# Patient Record
Sex: Male | Born: 1935 | Race: White | Hispanic: No | Marital: Married | Smoking: Never smoker
Health system: Southern US, Community
[De-identification: ages and names within clinical notes are randomized; demographics above are authoritative.]

## PROBLEM LIST (undated history)

## (undated) DIAGNOSIS — E538 Deficiency of other specified B group vitamins: Secondary | ICD-10-CM

## (undated) DIAGNOSIS — K219 Gastro-esophageal reflux disease without esophagitis: Secondary | ICD-10-CM

## (undated) DIAGNOSIS — D500 Iron deficiency anemia secondary to blood loss (chronic): Secondary | ICD-10-CM

## (undated) DIAGNOSIS — I4891 Unspecified atrial fibrillation: Secondary | ICD-10-CM

## (undated) DIAGNOSIS — N189 Chronic kidney disease, unspecified: Secondary | ICD-10-CM

## (undated) DIAGNOSIS — E781 Pure hyperglyceridemia: Secondary | ICD-10-CM

## (undated) DIAGNOSIS — K264 Chronic or unspecified duodenal ulcer with hemorrhage: Secondary | ICD-10-CM

## (undated) DIAGNOSIS — Z86010 Personal history of colonic polyps: Secondary | ICD-10-CM

## (undated) DIAGNOSIS — K589 Irritable bowel syndrome without diarrhea: Secondary | ICD-10-CM

## (undated) DIAGNOSIS — T8859XA Other complications of anesthesia, initial encounter: Secondary | ICD-10-CM

## (undated) DIAGNOSIS — K648 Other hemorrhoids: Secondary | ICD-10-CM

## (undated) DIAGNOSIS — E669 Obesity, unspecified: Secondary | ICD-10-CM

## (undated) DIAGNOSIS — J189 Pneumonia, unspecified organism: Secondary | ICD-10-CM

## (undated) DIAGNOSIS — K5790 Diverticulosis of intestine, part unspecified, without perforation or abscess without bleeding: Secondary | ICD-10-CM

## (undated) DIAGNOSIS — I509 Heart failure, unspecified: Secondary | ICD-10-CM

## (undated) DIAGNOSIS — T4145XA Adverse effect of unspecified anesthetic, initial encounter: Secondary | ICD-10-CM

## (undated) DIAGNOSIS — E119 Type 2 diabetes mellitus without complications: Secondary | ICD-10-CM

## (undated) DIAGNOSIS — G4733 Obstructive sleep apnea (adult) (pediatric): Secondary | ICD-10-CM

## (undated) DIAGNOSIS — M1990 Unspecified osteoarthritis, unspecified site: Secondary | ICD-10-CM

## (undated) DIAGNOSIS — R0602 Shortness of breath: Secondary | ICD-10-CM

## (undated) DIAGNOSIS — E7800 Pure hypercholesterolemia, unspecified: Secondary | ICD-10-CM

## (undated) DIAGNOSIS — I10 Essential (primary) hypertension: Secondary | ICD-10-CM

## (undated) DIAGNOSIS — E039 Hypothyroidism, unspecified: Secondary | ICD-10-CM

## (undated) DIAGNOSIS — Z794 Long term (current) use of insulin: Secondary | ICD-10-CM

## (undated) HISTORY — DX: Diverticulosis of intestine, part unspecified, without perforation or abscess without bleeding: K57.90

## (undated) HISTORY — PX: PORT A CATH REVISION: SHX6033

## (undated) HISTORY — DX: Unspecified atrial fibrillation: I48.91

## (undated) HISTORY — PX: COLONOSCOPY: SHX174

## (undated) HISTORY — PX: EYE SURGERY: SHX253

## (undated) HISTORY — PX: BACK SURGERY: SHX140

## (undated) HISTORY — PX: CHOLECYSTECTOMY: SHX55

## (undated) HISTORY — PX: OTHER SURGICAL HISTORY: SHX169

## (undated) HISTORY — DX: Other hemorrhoids: K64.8

## (undated) HISTORY — PX: TOTAL KNEE ARTHROPLASTY: SHX125

---

## 2003-07-21 ENCOUNTER — Ambulatory Visit (HOSPITAL_COMMUNITY): Admission: RE | Admit: 2003-07-21 | Discharge: 2003-07-22 | Payer: Self-pay | Admitting: Orthopedic Surgery

## 2007-10-08 ENCOUNTER — Inpatient Hospital Stay (HOSPITAL_COMMUNITY): Admission: RE | Admit: 2007-10-08 | Discharge: 2007-10-12 | Payer: Self-pay | Admitting: Orthopedic Surgery

## 2008-12-17 ENCOUNTER — Ambulatory Visit (HOSPITAL_COMMUNITY): Admission: RE | Admit: 2008-12-17 | Discharge: 2008-12-17 | Payer: Self-pay | Admitting: Orthopedic Surgery

## 2009-10-13 DIAGNOSIS — Z86010 Personal history of colonic polyps: Secondary | ICD-10-CM

## 2009-10-13 DIAGNOSIS — K648 Other hemorrhoids: Secondary | ICD-10-CM

## 2009-10-13 DIAGNOSIS — K5790 Diverticulosis of intestine, part unspecified, without perforation or abscess without bleeding: Secondary | ICD-10-CM

## 2009-10-13 DIAGNOSIS — Z860101 Personal history of adenomatous and serrated colon polyps: Secondary | ICD-10-CM

## 2009-10-13 HISTORY — DX: Personal history of colonic polyps: Z86.010

## 2009-10-13 HISTORY — DX: Diverticulosis of intestine, part unspecified, without perforation or abscess without bleeding: K57.90

## 2009-10-13 HISTORY — DX: Other hemorrhoids: K64.8

## 2009-10-13 HISTORY — DX: Personal history of adenomatous and serrated colon polyps: Z86.0101

## 2009-10-21 ENCOUNTER — Encounter: Payer: Self-pay | Admitting: Physician Assistant

## 2009-11-04 ENCOUNTER — Inpatient Hospital Stay (HOSPITAL_COMMUNITY): Admission: EM | Admit: 2009-11-04 | Discharge: 2009-11-07 | Payer: Self-pay | Admitting: Emergency Medicine

## 2009-11-05 ENCOUNTER — Ambulatory Visit: Payer: Self-pay | Admitting: Internal Medicine

## 2009-11-09 ENCOUNTER — Encounter (INDEPENDENT_AMBULATORY_CARE_PROVIDER_SITE_OTHER): Payer: Self-pay | Admitting: *Deleted

## 2009-11-23 ENCOUNTER — Telehealth: Payer: Self-pay | Admitting: Internal Medicine

## 2009-12-10 ENCOUNTER — Ambulatory Visit: Payer: Self-pay | Admitting: Internal Medicine

## 2009-12-10 DIAGNOSIS — K219 Gastro-esophageal reflux disease without esophagitis: Secondary | ICD-10-CM | POA: Insufficient documentation

## 2009-12-10 DIAGNOSIS — R1310 Dysphagia, unspecified: Secondary | ICD-10-CM | POA: Insufficient documentation

## 2009-12-10 DIAGNOSIS — D62 Acute posthemorrhagic anemia: Secondary | ICD-10-CM | POA: Insufficient documentation

## 2009-12-11 LAB — CONVERTED CEMR LAB
Basophils Absolute: 0 10*3/uL (ref 0.0–0.1)
Basophils Relative: 0.8 % (ref 0.0–3.0)
Eosinophils Absolute: 0.1 10*3/uL (ref 0.0–0.7)
Eosinophils Relative: 2.6 % (ref 0.0–5.0)
HCT: 29.5 % — ABNORMAL LOW (ref 39.0–52.0)
Hemoglobin: 10.4 g/dL — ABNORMAL LOW (ref 13.0–17.0)
Lymphocytes Relative: 33.5 % (ref 12.0–46.0)
Lymphs Abs: 1.5 10*3/uL (ref 0.7–4.0)
MCHC: 35.2 g/dL (ref 30.0–36.0)
MCV: 95.8 fL (ref 78.0–100.0)
Monocytes Absolute: 0.4 10*3/uL (ref 0.1–1.0)
Monocytes Relative: 8 % (ref 3.0–12.0)
Neutro Abs: 2.5 10*3/uL (ref 1.4–7.7)
Neutrophils Relative %: 55.1 % (ref 43.0–77.0)
Platelets: 257 10*3/uL (ref 150.0–400.0)
RBC: 3.08 M/uL — ABNORMAL LOW (ref 4.22–5.81)
RDW: 15.3 % — ABNORMAL HIGH (ref 11.5–14.6)
WBC: 4.5 10*3/uL (ref 4.5–10.5)

## 2009-12-22 ENCOUNTER — Telehealth (INDEPENDENT_AMBULATORY_CARE_PROVIDER_SITE_OTHER): Payer: Self-pay | Admitting: *Deleted

## 2010-01-13 ENCOUNTER — Telehealth: Payer: Self-pay | Admitting: Internal Medicine

## 2010-01-13 ENCOUNTER — Ambulatory Visit: Payer: Self-pay | Admitting: Gastroenterology

## 2010-01-13 DIAGNOSIS — Z860100 Personal history of colon polyps, unspecified: Secondary | ICD-10-CM | POA: Insufficient documentation

## 2010-01-13 DIAGNOSIS — M1990 Unspecified osteoarthritis, unspecified site: Secondary | ICD-10-CM | POA: Insufficient documentation

## 2010-01-13 DIAGNOSIS — A09 Infectious gastroenteritis and colitis, unspecified: Secondary | ICD-10-CM | POA: Insufficient documentation

## 2010-01-13 DIAGNOSIS — E039 Hypothyroidism, unspecified: Secondary | ICD-10-CM | POA: Insufficient documentation

## 2010-01-13 DIAGNOSIS — R634 Abnormal weight loss: Secondary | ICD-10-CM

## 2010-01-15 ENCOUNTER — Encounter: Payer: Self-pay | Admitting: Physician Assistant

## 2010-01-15 LAB — CONVERTED CEMR LAB
BUN: 43 mg/dL — ABNORMAL HIGH (ref 6–23)
Basophils Absolute: 0 10*3/uL (ref 0.0–0.1)
Basophils Relative: 0.6 % (ref 0.0–3.0)
CO2: 25 meq/L (ref 19–32)
Calcium: 8.9 mg/dL (ref 8.4–10.5)
Chloride: 110 meq/L (ref 96–112)
Creatinine, Ser: 2 mg/dL — ABNORMAL HIGH (ref 0.4–1.5)
Eosinophils Absolute: 0.1 10*3/uL (ref 0.0–0.7)
Eosinophils Relative: 2.1 % (ref 0.0–5.0)
GFR calc non Af Amer: 34.86 mL/min (ref >60)
Glucose, Bld: 166 mg/dL — ABNORMAL HIGH (ref 70–99)
HCT: 30.9 % — ABNORMAL LOW (ref 39.0–52.0)
Hemoglobin: 11.1 g/dL — ABNORMAL LOW (ref 13.0–17.0)
Lymphocytes Relative: 31.7 % (ref 12.0–46.0)
Lymphs Abs: 1.5 10*3/uL (ref 0.7–4.0)
MCHC: 36 g/dL (ref 30.0–36.0)
MCV: 96.2 fL (ref 78.0–100.0)
Monocytes Absolute: 0.4 10*3/uL (ref 0.1–1.0)
Monocytes Relative: 9.1 % (ref 3.0–12.0)
Neutro Abs: 2.7 10*3/uL (ref 1.4–7.7)
Neutrophils Relative %: 56.5 % (ref 43.0–77.0)
Platelets: 230 10*3/uL (ref 150.0–400.0)
Potassium: 5.2 meq/L — ABNORMAL HIGH (ref 3.5–5.1)
RBC: 3.21 M/uL — ABNORMAL LOW (ref 4.22–5.81)
RDW: 13.9 % (ref 11.5–14.6)
Sodium: 144 meq/L (ref 135–145)
WBC: 4.8 10*3/uL (ref 4.5–10.5)

## 2010-01-18 ENCOUNTER — Ambulatory Visit: Payer: Self-pay | Admitting: Physician Assistant

## 2010-01-20 LAB — CONVERTED CEMR LAB
BUN: 39 mg/dL — ABNORMAL HIGH (ref 6–23)
CO2: 26 meq/L (ref 19–32)
Calcium: 9.1 mg/dL (ref 8.4–10.5)
Chloride: 110 meq/L (ref 96–112)
Creatinine, Ser: 1.7 mg/dL — ABNORMAL HIGH (ref 0.4–1.5)
GFR calc non Af Amer: 40.94 mL/min (ref >60)
Glucose, Bld: 175 mg/dL — ABNORMAL HIGH (ref 70–99)
Potassium: 5 meq/L (ref 3.5–5.1)
Sodium: 144 meq/L (ref 135–145)

## 2010-01-28 ENCOUNTER — Ambulatory Visit: Payer: Self-pay | Admitting: Gastroenterology

## 2010-01-28 DIAGNOSIS — E785 Hyperlipidemia, unspecified: Secondary | ICD-10-CM | POA: Insufficient documentation

## 2010-01-28 DIAGNOSIS — K56609 Unspecified intestinal obstruction, unspecified as to partial versus complete obstruction: Secondary | ICD-10-CM | POA: Insufficient documentation

## 2010-01-28 DIAGNOSIS — I10 Essential (primary) hypertension: Secondary | ICD-10-CM | POA: Insufficient documentation

## 2010-01-28 DIAGNOSIS — G4730 Sleep apnea, unspecified: Secondary | ICD-10-CM | POA: Insufficient documentation

## 2010-03-04 ENCOUNTER — Ambulatory Visit: Payer: Self-pay | Admitting: Internal Medicine

## 2010-03-05 ENCOUNTER — Telehealth: Payer: Self-pay | Admitting: Internal Medicine

## 2010-03-05 ENCOUNTER — Ambulatory Visit: Payer: Self-pay | Admitting: Internal Medicine

## 2010-03-05 LAB — CONVERTED CEMR LAB
Basophils Absolute: 0 10*3/uL (ref 0.0–0.1)
Basophils Relative: 0.8 % (ref 0.0–3.0)
Eosinophils Absolute: 0.1 10*3/uL (ref 0.0–0.7)
Eosinophils Relative: 2.4 % (ref 0.0–5.0)
HCT: 30 % — ABNORMAL LOW (ref 39.0–52.0)
Hemoglobin: 10.6 g/dL — ABNORMAL LOW (ref 13.0–17.0)
Lymphocytes Relative: 30.8 % (ref 12.0–46.0)
Lymphs Abs: 1.5 10*3/uL (ref 0.7–4.0)
MCHC: 35.3 g/dL (ref 30.0–36.0)
MCV: 96.6 fL (ref 78.0–100.0)
Monocytes Absolute: 0.4 10*3/uL (ref 0.1–1.0)
Monocytes Relative: 8.9 % (ref 3.0–12.0)
Neutro Abs: 2.7 10*3/uL (ref 1.4–7.7)
Neutrophils Relative %: 57.1 % (ref 43.0–77.0)
Platelets: 212 10*3/uL (ref 150.0–400.0)
RBC: 3.1 M/uL — ABNORMAL LOW (ref 4.22–5.81)
RDW: 13.6 % (ref 11.5–14.6)
WBC: 4.8 10*3/uL (ref 4.5–10.5)

## 2010-03-09 LAB — CONVERTED CEMR LAB
Ferritin: 81.7 ng/mL (ref 22.0–322.0)
Folate: 19.2 ng/mL
Iron: 128 ug/dL (ref 42–165)
Saturation Ratios: 32.7 % (ref 20.0–50.0)
Transferrin: 279.8 mg/dL (ref 212.0–360.0)
Vitamin B-12: 166 pg/mL — ABNORMAL LOW (ref 211–911)

## 2010-06-04 ENCOUNTER — Ambulatory Visit: Payer: Self-pay | Admitting: Internal Medicine

## 2010-06-04 DIAGNOSIS — R1319 Other dysphagia: Secondary | ICD-10-CM

## 2010-06-04 DIAGNOSIS — R112 Nausea with vomiting, unspecified: Secondary | ICD-10-CM | POA: Insufficient documentation

## 2010-06-07 ENCOUNTER — Ambulatory Visit (HOSPITAL_COMMUNITY): Admission: RE | Admit: 2010-06-07 | Discharge: 2010-06-07 | Payer: Self-pay | Admitting: Internal Medicine

## 2010-07-07 ENCOUNTER — Telehealth: Payer: Self-pay | Admitting: Internal Medicine

## 2010-07-14 ENCOUNTER — Ambulatory Visit: Payer: Self-pay | Admitting: Internal Medicine

## 2010-09-08 ENCOUNTER — Ambulatory Visit
Admission: RE | Admit: 2010-09-08 | Discharge: 2010-09-08 | Payer: Self-pay | Source: Home / Self Care | Attending: Internal Medicine | Admitting: Internal Medicine

## 2010-09-08 ENCOUNTER — Other Ambulatory Visit: Payer: Self-pay | Admitting: Internal Medicine

## 2010-09-08 DIAGNOSIS — D510 Vitamin B12 deficiency anemia due to intrinsic factor deficiency: Secondary | ICD-10-CM | POA: Insufficient documentation

## 2010-09-08 LAB — CBC WITH DIFFERENTIAL/PLATELET
Basophils Absolute: 0 10*3/uL (ref 0.0–0.1)
Basophils Relative: 0.4 % (ref 0.0–3.0)
Eosinophils Absolute: 0.1 10*3/uL (ref 0.0–0.7)
Eosinophils Relative: 2 % (ref 0.0–5.0)
HCT: 30.9 % — ABNORMAL LOW (ref 39.0–52.0)
Hemoglobin: 10.8 g/dL — ABNORMAL LOW (ref 13.0–17.0)
Lymphocytes Relative: 27.2 % (ref 12.0–46.0)
Lymphs Abs: 1.4 10*3/uL (ref 0.7–4.0)
MCHC: 34.9 g/dL (ref 30.0–36.0)
MCV: 97.7 fl (ref 78.0–100.0)
Monocytes Absolute: 0.5 10*3/uL (ref 0.1–1.0)
Monocytes Relative: 8.9 % (ref 3.0–12.0)
Neutro Abs: 3.2 10*3/uL (ref 1.4–7.7)
Neutrophils Relative %: 61.5 % (ref 43.0–77.0)
Platelets: 200 10*3/uL (ref 150.0–400.0)
RBC: 3.16 Mil/uL — ABNORMAL LOW (ref 4.22–5.81)
RDW: 13.6 % (ref 11.5–14.6)
WBC: 5.2 10*3/uL (ref 4.5–10.5)

## 2010-09-10 ENCOUNTER — Encounter: Payer: Self-pay | Admitting: Internal Medicine

## 2010-09-16 NOTE — Assessment & Plan Note (Signed)
Summary: follow-up diarrhea...   History of Present Illness Visit Type: Follow-up Visit Primary GI MD: Yancey Flemings MD Primary Provider: Tarri Fuller Chief Complaint: still complains of diarrhea, 6-10 loose, watery stools a day History of Present Illness:   77 year old white male with hypertension, hyperlipidemia, long-standing type 2 diabetes mellitus, obesity, sleep apnea, hypothyroidism, osteoarthritis, "colon polyps", and GI bleed secondary to NSAID-induced duodenal ulcer March 2001. The patient was seen by our physician assistant twice in June for diarrhea. Laboratories were unremarkable except for abnormal creatinine, which is chronic. Multiple stool studies, including C. difficile, were negative. He was treated empirically with Flagyl and probiotics. He presents today for routine followup as instructed. Overall patient is doing well. He continues to describe 8-10 bowel movements per day. These are postprandial. Some formed, some watery. No blood. Normal for him as to 3 bowel movements per day. Nonocturnal component. No normal pain. He is had no weight loss. No fevers. Not using antidiarrheals. Colonoscopy in March 2011, elsewhere, was negative. Report reviewed. He is accompanied by his wife and granddaughter. He continues on PPI therapy. Other medications stable.   GI Review of Systems      Denies abdominal pain, acid reflux, belching, bloating, chest pain, dysphagia with liquids, dysphagia with solids, heartburn, loss of appetite, nausea, vomiting, vomiting blood, weight loss, and  weight gain.      Reports diarrhea.     Denies anal fissure, black tarry stools, change in bowel habit, constipation, diverticulosis, fecal incontinence, heme positive stool, hemorrhoids, irritable bowel syndrome, jaundice, light color stool, liver problems, rectal bleeding, and  rectal pain.    Current Medications (verified): 1)  Gemfibrozil 600 Mg Tabs (Gemfibrozil) .Marland Kitchen.. 1 By Mouth Twice Daily 2)   Glyburide-Metformin 2.5-500 Mg Tabs (Glyburide-Metformin) .... 2 By Mouth Two Times A Day 3)  Lantus 100 Unit/ml Soln (Insulin Glargine) .... 40 Units At At Bedtime 4)  Levoxyl 75 Mcg Tabs (Levothyroxine Sodium) .Marland Kitchen.. 1 By Mouth Once Daily 5)  Ferrous Fumarate 325 (106 Fe) Mg Tabs (Ferrous Fumarate) .... Take 1 Tab At Bedtime 6)  Multivitamins  Tabs (Multiple Vitamin) .... Take 1 Tab Daily 7)  Nexium 40 Mg Cpdr (Esomeprazole Magnesium) .... One Tablet By Mouth Once Daily  Allergies (verified): No Known Drug Allergies  Past History:  Past Medical History: Reviewed history from 01/28/2010 and no changes required. Arthritis ACUTE GI BLEED 4/11-SEVERAL DUODENAL ULCERS Adenomatous Colon Polyps-COLON 3/11 [place] FOR C.DIFF 6/11-TOXIN NEGATIVE AODM HYPOTHYROIDISM Hyperlipidemia Hypertension Obesity Sleep Apnea  Past Surgical History: Reviewed history from 01/28/2010 and no changes required. Cholecystectomy Kidney Stone Back Surgery Knee Replacement Neck surgery  Family History: Reviewed history from 12/10/2009 and no changes required. Family History of Diabetes: mother Family History of Heart Disease: father Family History of Uterine Cancer:mother Family History of Colon Cancer:grandmother  Social History: Reviewed history from 12/10/2009 and no changes required. Patient has never smoked.  Alcohol Use - no Daily Caffeine Use 2 per day Illicit Drug Use - no Patient does not get regular exercise.   Review of Systems       The patient complains of fatigue.  The patient denies allergy/sinus, anemia, anxiety-new, arthritis/joint pain, back pain, blood in urine, breast changes/lumps, confusion, cough, coughing up blood, depression-new, fainting, fever, headaches-new, hearing problems, heart murmur, heart rhythm changes, itching, muscle pains/cramps, night sweats, nosebleeds, shortness of breath, skin rash, sleeping problems, sore throat, swelling of feet/legs, swollen  lymph glands, thirst - excessive, urination - excessive, urination changes/pain, urine leakage, vision changes,  and voice change.    Vital Signs:  Patient profile:   77 year old male Height:      72 inches Weight:      264 pounds BMI:     35.93 Pulse rate:   76 / minute Pulse rhythm:   regular BP sitting:   124 / 74  (left arm) Cuff size:   regular  Vitals Entered By: Francee Piccolo CMA Duncan Dull) (March 04, 2010 9:59 AM)  Physical Exam  General:  Well developed, obese, well nourished, no acute distress. Head:  Normocephalic and atraumatic. Eyes:  PERRLA, no icterus.conjunctiva pink Mouth:  No deformity or lesions, . No thrush Neck:  thick Lungs:  Clear throughout to auscultation. Heart:  Regular rate and rhythm; no murmurs, rubs,  or bruits. Abdomen:  Soft, obese,nontender and nondistended. No masses, hepatosplenomegaly or hernias noted. Normal bowel sounds. Msk:  degenerative changes in the joints Pulses:  Normal pulses noted. Extremities:  no edema Neurologic:  alert and oriented Skin:  no jaundice Psych:  Alert and cooperative. Normal mood and affect.   Impression & Recommendations:  Problem # 1:  DIARRHEA (ICD-787.91) postprandial diarrhea without alarm features. No change with empiric therapies. Negative workup as outlined. Sounds like postinfectious IBS picture.  Plan: #1. Use Imodium p.r.n. as directed #2. Followup p.r.n. worsening symptoms or the development of alarm features  Problem # 2:  PERSONAL HX COLONIC POLYPS (ICD-V12.72) prior history of colon polyps. Negative colonoscopy earlier this year. Routine followup due around March 2016. Recall placed in the computer  Problem # 3:  ULCER-DUODENAL (ICD-532.9) duodenal ulcer with bleeding secondary to NSAIDs  Plan:  #1. Avoid NSAIDs #2. Continue PPI  Problem # 4:  ACUTE POSTHEMORRHAGIC ANEMIA (ICD-285.1) posthemorrhagic anemia secondary to ulcer bleed. Recent hemoglobins improving but not normal. Currently  on iron.  Plan: #1. Continue iron #2. Followup CBC today  Other Orders: TLB-CBC Platelet - w/Differential (85025-CBCD)  Patient Instructions: 1)  Labs ordered for patient to have drawn today. 2)  Recall Colonoscopy for 10/2014. 3)  Please schedule a follow-up appointment as needed.  4)  The medication list was reviewed and reconciled.  All changed / newly prescribed medications were explained.  A complete medication list was provided to the patient / caregiver. 5)  Copy Dr. Tarri Fuller

## 2010-09-16 NOTE — Assessment & Plan Note (Signed)
Summary: Diarrhea / N/V   History of Present Illness Visit Type: Follow-up Visit Primary GI MD: Chad Flemings MD Primary Chad Carter: Chad Fuller, MD  Requesting Chad Carter: na Chief Complaint: Nausea and vomiting, and diarrhea History of Present Illness:   77 y.o. w/  HTN, hyperlipidemia, longstanding DM, CRI, obesity, sleep apnea, hypothyroidism, PUD with bleeding DU secondary to NSAIDS, and B12 deficiency. Last seen 03-04-10 for diarrhea. Comes in w/ wife. C/o ongoing postprandial urgency w/ loose stools. No wt loss. Next, intermittent N/V last few weeks. Generally bilious. Finally, vague solid and liquid dysphagia. Normal esophagus on EGD 10-2009.   GI Review of Systems    Reports dysphagia with liquids, dysphagia with solids, nausea, and  vomiting.      Denies abdominal pain, acid reflux, belching, bloating, chest pain, heartburn, loss of appetite, vomiting blood, weight loss, and  weight gain.      Reports diarrhea.     Denies anal fissure, black tarry stools, change in bowel habit, constipation, diverticulosis, fecal incontinence, heme positive stool, hemorrhoids, irritable bowel syndrome, jaundice, light color stool, liver problems, rectal bleeding, and  rectal pain.    Current Medications (verified): 1)  Gemfibrozil 600 Mg Tabs (Gemfibrozil) .Marland Kitchen.. 1 By Mouth Twice Daily 2)  Glyburide-Metformin 2.5-500 Mg Tabs (Glyburide-Metformin) .... 2 By Mouth Two Times A Day 3)  Lantus 100 Unit/ml Soln (Insulin Glargine) .... 40 Units At At Bedtime 4)  Levoxyl 75 Mcg Tabs (Levothyroxine Sodium) .Marland Kitchen.. 1 By Mouth Once Daily 5)  Multivitamins  Tabs (Multiple Vitamin) .... Take 1 Tab Daily 6)  Nexium 40 Mg Cpdr (Esomeprazole Magnesium) .... Take 1 P.o. One Half Hr. Before Breakfast 7)  Enalapril-Hydrochlorothiazide 10-25 Mg Tabs (Enalapril-Hydrochlorothiazide) .... One Tablet By Mouth Once Daily 8)  Cyanocobalamin 1000 Mcg/ml Soln (Cyanocobalamin) .... Once A Month 9)  Cvs Insulin Syringe 30g X 1/2"  0.3 Ml Misc (Insulin Syringe-Needle U-100) .... As Directed 10)  Relion Insulin Syringe 30g X 5/16" 0.5 Ml Misc (Insulin Syringe-Needle U-100) .... As Directed  Allergies (verified): No Known Drug Allergies  Past History:  Past Medical History: Arthritis ACUTE GI BLEED 4/11-SEVERAL DUODENAL ULCERS Adenomatous Colon Polyps-COLON 3/11 [place] FOR C.DIFF 6/11-TOXIN NEGATIVE AODM Obesity DIARRHEA (ICD-787.91) SLEEP APNEA (ICD-780.57) SMALL BOWEL OBSTRUCTION (ICD-560.9) HYPERLIPIDEMIA (ICD-272.4) HYPERTENSION (ICD-401.9) OSTEOARTHRITIS (ICD-715.9) HYPOTHYROIDISM (ICD-244.9) DIABETES MELLITUS-TYPE II (ICD-250.00) FAMILY HX COLON CANCER (ICD-V16.0) PERSONAL HX COLONIC POLYPS (ICD-V12.72) ULCER-DUODENAL (ICD-532.9) ANEMIA-UNSPECIFIED (ICD-285.9) LOSS OF WEIGHT (ICD-783.21) INFECTIOUS DIARRHEA (ICD-009.2) DYSPHAGIA UNSPECIFIED (ICD-787.20) GERD (ICD-530.81) ACUTE POSTHEMORRHAGIC ANEMIA (ICD-285.1) ACUT DUOD ULCER W/HEMORR W/O MENTION OBSTRUCTION (ICD-532.00)  Past Surgical History: Reviewed history from 01/28/2010 and no changes required. Cholecystectomy Kidney Stone Back Surgery Knee Replacement Neck surgery  Family History: Reviewed history from 12/10/2009 and no changes required. Family History of Diabetes: mother Family History of Heart Disease: father Family History of Uterine Cancer:mother Family History of Colon Cancer:grandmother  Social History: Retired  Married Patient has never smoked.  Alcohol Use - no Daily Caffeine Use 2 per day Illicit Drug Use - no Patient does not get regular exercise.   Review of Systems       The patient complains of anemia and arthritis/joint pain.  The patient denies allergy/sinus, anxiety-new, back pain, blood in urine, breast changes/lumps, change in vision, confusion, cough, coughing up blood, depression-new, fainting, fatigue, fever, headaches-new, hearing problems, heart murmur, heart rhythm changes, itching,  menstrual pain, muscle pains/cramps, night sweats, nosebleeds, pregnancy symptoms, shortness of breath, skin rash, sleeping problems, sore throat, swelling of feet/legs, swollen lymph glands,  thirst - excessive , urination - excessive , urination changes/pain, urine leakage, vision changes, and voice change.    Vital Signs:  Patient profile:   77 year old male Height:      72 inches Weight:      266 pounds BMI:     36.21 BSA:     2.41 Pulse rate:   72 / minute Pulse rhythm:   regular BP sitting:   142 / 80  (left arm) Cuff size:   regular  Vitals Entered By: Ok Anis CMA (June 04, 2010 10:45 AM)  Physical Exam  General:  Well developed, obese,well nourished, no acute distress. Head:  Normocephalic and atraumatic. Eyes:  PERRLA, no icterus. Mouth:  No deformity or lesions Lungs:  Clear throughout to auscultation. Heart:  Regular rate and rhythm; no murmurs, rubs,  or bruits. Abdomen:  Soft, obese,nontender and nondistended. No masses, hepatosplenomegaly or hernias noted. Normal bowel sounds. Msk:  Symmetrical with no gross deformities. Normal posture. Pulses:  Normal pulses noted. Extremities:  No  edema or deformities noted. Neurologic:  Alert and  oriented x4 Skin:  Intact without significant lesions or rashes. Psych:  Alert and cooperative. Normal mood and affect.   Impression & Recommendations:  Problem # 1:  DIARRHEA (ICD-787.91) Most c/w IBS (post infectious)  Plan: 1.  Prescribe Librax one by mouth ac and hs as needed 2. F/U in 6 weeks  Problem # 2:  DYSPHAGIA (ZOX-096.04) vague. normal esophagus on EGD  Plan: 1.UBI / Ba swallow w/ tablet  Problem # 3:  NAUSEA AND VOMITING (ICD-787.01) mild. ? Gastroparesis. r/o partial GOO secondary to prior ulcer disease   Plan: 1. UGI series  Problem # 4:  DUODENAL ULCER (ICD-532.90) wants cheaper PPI.  PLAN: 1. Prescribe omeprazole 20mg  qd  Other Orders: Barium Swallow with Tablet (BS w/tab)  Patient  Instructions: 1)  Barium Swallow with Tablet scheduled 06/07/10 10:00 am at [facility]. 2)  Arrive at 9:45 am 3)  Librax and Omeprazole Rx. sent to your pharmacy. 4)  Please schedule a follow-up appointment in 6 weeks.  5)  Copy sent to : Chad Fuller, MD  6)  The medication list was reviewed and reconciled.  All changed / newly prescribed medications were explained.  A complete medication list was provided to the patient / caregiver. Prescriptions: OMEPRAZOLE 20 MG CPDR (OMEPRAZOLE) 1 by mouth once daily  #30 x 11   Entered by:   Milford Cage NCMA   Authorized by:   Hilarie Fredrickson MD   Signed by:   Milford Cage NCMA on 06/04/2010   Method used:   Electronically to        [facility]  [address] [address] [phone][facility] (retail)       [address]       [place], [place]  [address]       Ph: [phone] or [phone]       Fax: [phone]   RxID:   [id number] LIBRAX 2.5-5 MG CAPS (CLIDINIUM-CHLORDIAZEPOXIDE) take 1 by mouth before meals and at bedtime  #100 x 6   Entered by:   Milford Cage NCMA   Authorized by:   Hilarie Fredrickson MD   Signed by:   Milford Cage NCMA on 06/04/2010   Method used:   [facility]  [address] [phone][facility]       [address]  [place], [place]  [address]       Ph: [phone] or [phone]       Fax: [phone]   RxID:   [id number]

## 2010-09-16 NOTE — Assessment & Plan Note (Addendum)
Summary: 8 WEEK FU - chronic diarrhea   History of Present Illness Visit Type: Follow-up Visit Primary GI MD: Yancey Flemings MD Primary Provider: Tarri Fuller, MD  Requesting Provider: na Chief Complaint: Patient here for f/u diarrhea. He states that his diarrhea has gotten no better. He states that he has bowel movements between 2-10 times daily. There has been no blood in stool. He has had some lower abdominal discomfort. Of note, per last office note patient was to d.c omeprazole. He states that he has continued to take it. History of Present Illness:   77 year old with hypertension, hyperlipidemia, obesity, sleep apnea, long-standing diabetes mellitus, chronic renal insufficiency, hypothyroidism, peptic ulcer disease complicated by bleeding, and B12 deficiency. He was last evaluated July 14, 2010 for ongoing problems with diarrhea. She presents today for followup. He is accompanied by his wife. We made a number of recommendations at the time of his last visit. Unfortunately, he has not followed any of them. Unclear why, but states not sure what his medications are to be used for. He continues with 8-10 bowel movements per day which he describes as loose. Occasionally at night. Despite this, he continues to gain weight.. They Inquire about followup CBC.   GI Review of Systems    Reports abdominal pain, acid reflux, bloating, chest pain, and  heartburn.     Location of  Abdominal pain: lower abdomen.    Denies belching, dysphagia with liquids, dysphagia with solids, loss of appetite, nausea, vomiting, vomiting blood, weight loss, and  weight gain.      Reports black tarry stools, change in bowel habits, diarrhea, diverticulosis, and  irritable bowel syndrome.     Denies anal fissure, constipation, fecal incontinence, heme positive stool, hemorrhoids, jaundice, light color stool, liver problems, rectal bleeding, and  rectal pain.    Current Medications (verified): 1)  Gemfibrozil 600 Mg  Tabs (Gemfibrozil) .Marland Kitchen.. 1 By Mouth Twice Daily 2)  Glyburide-Metformin 2.5-500 Mg Tabs (Glyburide-Metformin) .... 2 By Mouth Two Times A Day 3)  Lantus 100 Unit/ml Soln (Insulin Glargine) .... 40 Units At At Bedtime 4)  Levoxyl 75 Mcg Tabs (Levothyroxine Sodium) .Marland Kitchen.. 1 By Mouth Once Daily 5)  Multivitamins  Tabs (Multiple Vitamin) .... Take 1 Tab Daily 6)  Enalapril-Hydrochlorothiazide 10-25 Mg Tabs (Enalapril-Hydrochlorothiazide) .... One Tablet By Mouth Once Daily 7)  Cyanocobalamin 1000 Mcg/ml Soln (Cyanocobalamin) .... Once A Month 8)  Cvs Insulin Syringe 30g X 1/2" 0.3 Ml Misc (Insulin Syringe-Needle U-100) .... As Directed 9)  Relion Insulin Syringe 30g X 5/16" 0.5 Ml Misc (Insulin Syringe-Needle U-100) .... As Directed 10)  Omeprazole 20 Mg Cpdr (Omeprazole) .... Take 1 Tablet By Mouth Once A Day 11)  Ferrous Sulfate 325 (65 Fe) Mg Tabs (Ferrous Sulfate) .... Take 1 Tablet By Mouth Once Daily  Allergies (verified): No Known Drug Allergies  Past History:  Past Medical History: Reviewed history from 07/14/2010 and no changes required. Arthritis ACUTE GI BLEED 4/11-SEVERAL DUODENAL ULCERS Adenomatous Colon Polyps-COLON 3/11 [place] FOR C.DIFF 6/11-TOXIN NEGATIVE AODM Obesity DIARRHEA (ICD-787.91) SLEEP APNEA (ICD-780.57) SMALL BOWEL OBSTRUCTION (ICD-560.9) HYPERLIPIDEMIA (ICD-272.4) HYPERTENSION (ICD-401.9) OSTEOARTHRITIS (ICD-715.9) HYPOTHYROIDISM (ICD-244.9) DIABETES MELLITUS-TYPE II (ICD-250.00) FAMILY HX COLON CANCER (ICD-V16.0) PERSONAL HX COLONIC POLYPS (ICD-V12.72) ULCER-DUODENAL (ICD-532.9) ANEMIA-UNSPECIFIED (ICD-285.9) LOSS OF WEIGHT (ICD-783.21) INFECTIOUS DIARRHEA (ICD-009.2) DYSPHAGIA UNSPECIFIED (ICD-787.20) GERD (ICD-530.81) ACUTE POSTHEMORRHAGIC ANEMIA (ICD-285.1) ACUT DUOD ULCER W/HEMORR W/O MENTION OBSTRUCTION (ICD-532.00) NAUSEA AND VOMITING (ICD-787.01)  Past Surgical History: Cholecystectomy Kidney Stone Back Surgery Left Knee  Replacement Neck surgery  Family  History: Family History of Diabetes: mother, Grandfather Family History of Heart Disease: father Family History of Uterine Cancer:mother Family History of Colon Cancer:grandmother  Social History: Retired  Married Patient has never smoked.  Alcohol Use - no Daily Caffeine Use 2-4 per day Illicit Drug Use - no Patient does not get regular exercise.   Review of Systems       The patient complains of arthritis/joint pain, back pain, fatigue, headaches-new, hearing problems, muscle pains/cramps, and shortness of breath.  The patient denies allergy/sinus, anemia, anxiety-new, blood in urine, breast changes/lumps, change in vision, confusion, cough, coughing up blood, depression-new, fainting, fever, heart murmur, heart rhythm changes, itching, menstrual pain, night sweats, nosebleeds, pregnancy symptoms, skin rash, sleeping problems, sore throat, swelling of feet/legs, swollen lymph glands, thirst - excessive , urination - excessive , urination changes/pain, urine leakage, vision changes, and voice change.    Vital Signs:  Patient profile:   77 year old male Height:      72 inches Weight:      274.38 pounds BMI:     37.35 BSA:     2.44 Pulse rate:   92 / minute Pulse rhythm:   regular BP sitting:   152 / 80 Cuff size:   regular  Vitals Entered By: Lamona Curl CMA Duncan Dull) (September 08, 2010 1:44 PM)  Physical Exam  General:  Well developed, obese,well nourished, no acute distress. Eyes:  PERRLA, no icterus. Mouth:  No deformity or lesions. Lungs:  Clear throughout to auscultation. Heart:  Regular rate and rhythm; no murmurs, rubs,  or bruits. Abdomen:  Soft, obese,nontender and nondistended. No masses, hepatosplenomegaly or hernias noted. Normal bowel sounds. Pulses:  Normal pulses noted. Extremities:  no edema Neurologic:  alert oriented Skin:  Intact without significant lesions or rashes. Psych:  Alert and cooperative. Normal mood and  affect.   Impression & Recommendations:  Problem # 1:  DIARRHEA (ICD-787.91) ongoing diarrhea. 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 Unfortunately, did not follow recommendations from last visit. Problem unchanged  Plan: #1. Stop omeprazole as this can cause diarrhea #2. Start ranitidine 150 mg p.o. b.i.d. He did pick this up at pharmacy #3. Prescribed Lomotil. Previously prescribed but not picked up. Take as directed. #4. Again encouraged to talk to his primary provider regarding an alternative to metformin, as this may cause diarrhea #5. Check C. difficile PCR #6. Followup in 2 months. If his problem continues, consider colonoscopy with biopsies to rule out microscopic colitis  Problem # 2:  PERNICIOUS ANEMIA (ICD-281.0) follow up CBC today  Other Orders: TLB-CBC Platelet - w/Differential (85025-CBCD) T-C diff by PCR ([id number])  Patient Instructions: 1)  Copy sent to : Tarri Fuller, MD  2)  You will go to the basement today for labs 3)  We are going to represcribe your Lomotil prescription for your diarrhea 4)  Stop Omeprazole and start your Ranitidine 5)  Talk with your PCP regarding your Metformin 6)  The medication list was reviewed and reconciled.  All changed / newly prescribed medications were explained.  A complete medication list was provided to the patient / caregiver. Prescriptions: LOMOTIL 2.5-0.025 MG TABS (DIPHENOXYLATE-ATROPINE) 1-2 by mouth two times a day as needed  #60 x 0   Entered by:   Merri Ray CMA (AAMA)   Authorized by:   Hilarie Fredrickson MD   Signed by:   Merri Ray CMA (AAMA) on 09/08/2010   Method used:   Printed then faxed to .[facility] KitchenMarland Kitchen  [facility]  [address] [phone][facility] (retail)       [address]       [place], [place]  [address]       Ph: [phone] or [phone]       Fax: [phone]   RxID:   [id number]

## 2010-09-16 NOTE — Procedures (Signed)
Summary: George Ina MD  T Bing Plume MD   Imported By: Lester Laurel 01/19/2010 16:10:96  _____________________________________________________________________  External Attachment:    Type:   Image     Comment:   External Document

## 2010-09-16 NOTE — Progress Notes (Signed)
  Phone Note Refill Request Message from:  Patient on March 05, 2010 4:29 PM     Prescriptions: NEXIUM 40 MG CPDR (ESOMEPRAZOLE MAGNESIUM) Take 1 p.o. one half hr. before breakfast  #30 x 11   Entered by:   Milford Cage NCMA   Authorized by:   Hilarie Fredrickson MD   Signed by:   Milford Cage NCMA on 03/05/2010   Method used:   [facility]  [address] [phone]* (retail)       [address]       [place], [place]  [address]       Ph: [phone] or [phone]       Fax: [phone]   RxID:   [id number]

## 2010-09-16 NOTE — Progress Notes (Signed)
  Phone Note Other Incoming   Request: Send information Summary of Call: Request for records received from [facility]. Request forwarded to [employer].

## 2010-09-16 NOTE — Progress Notes (Signed)
Summary: diarrhea  Phone Note Call from Patient Call back at Home Phone [phone]   Caller: Patient Call For: Dr. Marina Goodell Reason for Call: Talk to Nurse Summary of Call: reporting diarrhea since ulcer surgery Initial call taken by: Vallarie Mare,  January 13, 2010 9:27 AM  Follow-up for Phone Call        Pt. did not report loose stools at [facility] on 12/10/09  because he thought it was probably caused by his medication.Has sm.amt of formed stool followed by explosive loose stool within15 minutes to up to an hr. after eating.Has urgency.Stool black but is on iron.Has been on bland diet. Follow-up by: Teryl Lucy RN,  January 13, 2010 9:50 AM  Additional Follow-up for Phone Call Additional follow up Details #1::        how many BMs per day? Does it awaken him at night? Is he having stomach pain or fever? If he is having greater than 5 bowel movements per day or answers yes or the other questions, he needs to be seen in the office. Otherwise, hold PPI for one week and use Imodium p.r.n. and report back to [place] with followup in one week Additional Follow-up by: Hilarie Fredrickson MD,  January 13, 2010 10:08 AM    Additional Follow-up for Phone Call Additional follow up Details #2::    He has 4-5 stools a day some do occurr at night.No fever.Has cramping with some stools. Appt. for 1:15 today with PA. Follow-up by: Teryl Lucy RN,  January 13, 2010 10:42 AM

## 2010-09-16 NOTE — Progress Notes (Signed)
Summary: Ask nurse question  Phone Note Other Incoming   Caller: [facility]  [phone] [facility]  Summary of Call: Would like to  ask Dr Lamar Sprinkles nurse if he has had HPylory test done. Initial call taken by: Leanor Kail [facility],  July 07, 2010 11:41 AM  Follow-up for Phone Call        Case  Manager, Diannia Ruder at [place], notified pt  had H. Pylori test done on 11/05/09. Dr Marina Goodell ordered the test at recent EGD; test was negative. Follow-up by: Teryl Lucy RN,  July 07, 2010 12:01 PM

## 2010-09-16 NOTE — Progress Notes (Signed)
Summary: return phone call  Phone Note Call from Patient Call back at Home Phone [phone]   Caller: Patient Call For: Dr. Marina Goodell Reason for Call: Talk to Nurse Summary of Call: pt thinks someone from this office called him yesterday possibly about his labwork results... also thinks he is supposed to have Nexium [facility] in [facility] [place] Initial call taken by: Vallarie Mare,  March 05, 2010 8:29 AM  Follow-up for Phone Call        Pt. will come for addt'l lab work and Nexium rx. sent to pharmacy Follow-up by: Teryl Lucy RN,  March 05, 2010 8:46 AM    New/Updated Medications: NEXIUM 40 MG CPDR (ESOMEPRAZOLE MAGNESIUM) Take 1 p.o. one half hr. before breakfast Prescriptions: NEXIUM 40 MG CPDR (ESOMEPRAZOLE MAGNESIUM) Take 1 p.o. one half hr. before breakfast  #30 x 11   Entered by:   Teryl Lucy RN   Authorized by:   Hilarie Fredrickson MD   Signed by:   Teryl Lucy RN on 03/05/2010   Method used:   [facility] to        [facility]  [address] [phone]* (retail)       [address]       [place], [place]  [address]       Ph: [phone] or [phone]       Fax: [phone]   RxID:   [id number]

## 2010-09-16 NOTE — Assessment & Plan Note (Signed)
Summary: F/U Diarrhea, C-Diff   History of Present Illness Visit Type: Follow-up Visit Primary GI MD: Yancey Flemings MD Primary Provider: Tarri Fuller Chief Complaint: diarrhea, c-diff since surgery in March 2011 History of Present Illness:   PLEASANT 77 YO MALE KNOWN TO DR. PERRY. HE WAS SEEN 01/13/10 WITH A DIARRHEAL ILLNESS  WHICH STARTED WHILE HE WAS HOSPITALIZE [place] A GI BLEED/DUODENAL ULCERS IN 5/11. INTERESTINGLY HE HAD HAD A SCREENING COLONOSCOPY IN [place]  BOUT A MONTH BEFORE THAT -NEGATIVE. HE WAS FELT TO BE INCREASED RISK FOR C.DIFF. HE HAS BEEN ON FLAGYL 250 MG -4 X DAILY AND FLORASTOR. STOOL STUDIES WERE NEGATIVE. HE SAYS HE FEELS BETTER BUT IS STILL HAVING DIARRHEA. HIS STOOLS ARE NOT AS WATERY/SOME SEMIFORMED-USAULLY HAVING 6-7 BM'S DAILY,NO BLOOD. HE HAS NO ABDOMINAL PAIN,DOES HAVE URGENCY. APPETITE OK,STILL NOT SURE WHAT TO EAT-WEIGHT STABLE. HE IS STILL FINISHING HIS COURSE OF ABX.  ONLY NEW MED HAS BEEN OMEPRAZOLE WHICH HEWAS PUT ON TWICE DAILY WITH HIS BLEED.   GI Review of Systems    Reports abdominal pain, acid reflux, bloating, and  nausea.     Location of  Abdominal pain: lower abdomen.    Denies belching, chest pain, dysphagia with liquids, dysphagia with solids, heartburn, loss of appetite, vomiting, vomiting blood, weight loss, and  weight gain.      Reports black tarry stools and  diarrhea.     Denies anal fissure, change in bowel habit, constipation, diverticulosis, fecal incontinence, heme positive stool, hemorrhoids, irritable bowel syndrome, jaundice, light color stool, liver problems, rectal bleeding, and  rectal pain.    Current Medications (verified): 1)  Gemfibrozil 600 Mg Tabs (Gemfibrozil) .Marland Kitchen.. 1 By Mouth Twice Daily 2)  Glyburide-Metformin 2.5-500 Mg Tabs (Glyburide-Metformin) .... 2 By Mouth Two Times A Day 3)  Lantus 100 Unit/ml Soln (Insulin Glargine) .... 40 Units At At Bedtime 4)  Levoxyl 75 Mcg Tabs (Levothyroxine Sodium) .Marland Kitchen.. 1 By Mouth Once  Daily 5)  Ferrous Fumarate 325 (106 Fe) Mg Tabs (Ferrous Fumarate) .... Take 1 Tab At Bedtime 6)  Multivitamins  Tabs (Multiple Vitamin) .... Take 1 Tab Daily 7)  Omeprazole 20 Mg Cpdr (Omeprazole) .... Take 1 Tab 30 Min Before Breakfast 8)  Metronidazole 250 Mg Tabs (Metronidazole) .... Take 1 Tab 4 Times Daily X 14 Days 9)  Florastor 250 Mg Caps (Saccharomyces Boulardii) .... Take 1 Tab Twice Daily X 14 Days  Allergies (verified): No Known Drug Allergies  Past History:  Past Medical History: Arthritis ACUTE GI BLEED 4/11-SEVERAL DUODENAL ULCERS Adenomatous Colon Polyps-COLON 3/11 [place] TREATED FOR C.DIFF 6/11-TOXIN NEGATIVE AODM HYPOTHYROIDISM Hyperlipidemia Hypertension Obesity Sleep Apnea  Past Surgical History: Cholecystectomy Kidney Stone Back Surgery Knee Replacement Neck surgery  Family History: Reviewed history from 12/10/2009 and no changes required. Family History of Diabetes: mother Family History of Heart Disease: father Family History of Uterine Cancer:mother Family History of Colon Cancer:grandmother  Social History: Reviewed history from 12/10/2009 and no changes required. Patient has never smoked.  Alcohol Use - no Daily Caffeine Use 2 per day Illicit Drug Use - no Patient does not get regular exercise.   Review of Systems       The patient complains of arthritis/joint pain, back pain, fatigue, hearing problems, shortness of breath, and swelling of feet/legs.  The patient denies allergy/sinus, anemia, anxiety-new, blood in urine, breast changes/lumps, change in vision, confusion, cough, coughing up blood, depression-new, fainting, fever, headaches-new, heart murmur, heart rhythm changes, itching, menstrual pain, muscle pains/cramps, night sweats,  nosebleeds, pregnancy symptoms, sleeping problems, sore throat, swollen lymph glands, thirst - excessive, urination - excessive, urination changes/pain, urine leakage, vision changes, and voice change.           ROS OTHERWISE AS IN HPI  Vital Signs:  Patient profile:   77 year old male Height:      72 inches Weight:      262.50 pounds BMI:     35.73 Pulse rate:   76 / minute Pulse rhythm:   regular BP sitting:   138 / 76  (left arm) Cuff size:   regular  Vitals Entered By: June McMurray CMA Duncan Dull) (January 28, 2010 1:12 PM)  Physical Exam  General:  Well developed, well nourished, no acute distress. Head:  Normocephalic and atraumatic. Eyes:  PERRLA, no icterus. Lungs:  Clear throughout to auscultation. Heart:  Regular rate and rhythm; no murmurs, rubs,  or bruits. Abdomen:  LARGE ,SOFT, NONTENDER,NO MASS OR HSM,BS+ Rectal:  NOT DONE Extremities:  No clubbing, cyanosis, edema or deformities noted. Neurologic:  Alert and  oriented x4;  grossly normal neurologically. Psych:  Alert and cooperative. Normal mood and affect.   Impression & Recommendations:  Problem # 1:  DIARRHEA-PRESUMED INFECTIOUS (ICD-009.3) Assessment Improved 77 YO MALE WITH PRESUMED C.DIFF WITH DIARRHEA ONSET DURING HOSPITALIZATION FOR UPPER GI BLEED. HE IS STABLE,SOMEWHAT IMPROVED ALSO CONSIDER MEDICATION INDUCED DIARRHEA( IE two times a day OMEPRAZOLE).   FINISSH CURRENT 14 DAY COURSE OF FLAGYL AND FLORASTOR LOW RESIDUE DIET STOP OMEPRAZOLE  TRIAL OF NEXIUM 40 MG ONCE DAILY IN AM. FOLLOW UP WITH DR. PERRY IN 2 WEEKS.  Problem # 2:  ULCER-DUODENAL (ICD-532.9) Assessment: Unchanged ACUTE BLEED 4/11- CONTINUE PPI BUT SWITCH TO NEXIUM 40 MG ONCE DAILY  Problem # 3:  PERSONAL HX COLONIC POLYPS (ICD-V12.72) Assessment: Comment Only NEGATIVE COLONOSCOPY 3/11-[place]- PT WITH FAMILY HX -FOLLOW UP IN 2016  Problem # 4:  FAMILY HX COLON CANCER (ICD-V16.0) Assessment: Comment Only  Patient Instructions: 1)  Nexium samples given for pt to take once daily. 2)  Please continue current medications.  3)  Follow-up with Dr. Marina Goodell on 03/04/10 at 10:00am. 4)  Copy sent to : Tarri Fuller 5)  The medication  list was reviewed and reconciled.  All changed / newly prescribed medications were explained.  A complete medication list was provided to the patient / caregiver.

## 2010-09-16 NOTE — Progress Notes (Signed)
Summary: Triage / questions  Phone Note Call from Patient Call back at Home Phone [phone]   Caller: Patient Call For: Dr. Marina Goodell Reason for Call: Talk to Nurse Summary of Call: pt is out of his meds--carafate (he thinks that is the name of it)  and wants to know if he should continue taking it Initial call taken by: Karna Christmas,  November 23, 2009 12:11 PM  Follow-up for Phone Call         message left to call back   Teryl Lucy RN  November 23, 2009 12:25 PM Pt. is feeling much better .Denies epigastric pain says he feels as good as he did prior to hosp. stay. Has refills on carafate but doesn't know if he should get refills.Is on Protonix b.i.d. Asking if he can go ahead and play golf says if he gets tired he will just quit.Has ov in 2 weeks.Stools are brown in color. Follow-up by: Teryl Lucy RN,  November 23, 2009 2:37 PM  Additional Follow-up for Phone Call Additional follow up Details #1::        stay on two times a day PPI. Forget carafate. ok to play golf. Check on clo test result Additional Follow-up by: Hilarie Fredrickson MD,  November 23, 2009 2:40 PM    Additional Follow-up for Phone Call Additional follow up Details #2::    Pt. ntfd. of Dr.Chima Astorino's orders. Follow-up by: Teryl Lucy RN,  November 23, 2009 2:48 PM

## 2010-09-16 NOTE — Assessment & Plan Note (Signed)
Summary: Followup-loose stools with urgency   History of Present Illness Visit Type: Follow-up Visit Primary GI MD: Yancey Flemings MD Primary Provider: Tarri Fuller, MD  Requesting Provider: na Chief Complaint: F/u for diarrhea and nausea and vomiting. Pt c/o diarrhea  History of Present Illness:   77 year old with hypertension, hyperlipidemia, long-standing diabetes mellitus, chronic renal insufficiency, obesity, sleep apnea, hypothyroidism, peptic ulcer disease with bleeding duodenal ulcer secondary to NSAIDs, and B12 deficiency. Last evaluated June 04, 2010 for ongoing problems with diarrhea and urgency as well as nausea with vomiting. He complained of vague dysphagia with prior normal esophagus on endoscopy. Barium swallow was performed October 24 and found to be unremarkable. His change in bowel habits are felt likely post infectious irritable bowel syndrome. He was prescribed Librax. His wife thinks it has helped some (she accompanies him today at the office visit), though he is less certain. He continues with modest weight gain. He tells me that historically he has proximally 4 bowel movements per day. Currently he is having approximately 4 bowel movements per day. Bowel movements tend to be loose. He is rarely awoken with symptoms and rarely has a day with no bowel movement. The biggest complaint is urgency with a risk of incontinence, which is have on 3 occasions. We have discussed his medications previously. Other empiric therapies including metronidazole have not helped. No new issues.   GI Review of Systems      Denies abdominal pain, acid reflux, belching, bloating, chest pain, dysphagia with liquids, dysphagia with solids, heartburn, loss of appetite, nausea, vomiting, vomiting blood, weight loss, and  weight gain.      Reports diarrhea.     Denies anal fissure, black tarry stools, change in bowel habit, constipation, diverticulosis, fecal incontinence, heme positive stool,  hemorrhoids, irritable bowel syndrome, jaundice, light color stool, liver problems, rectal bleeding, and  rectal pain.    Current Medications (verified): 1)  Gemfibrozil 600 Mg Tabs (Gemfibrozil) .Marland Kitchen.. 1 By Mouth Twice Daily 2)  Glyburide-Metformin 2.5-500 Mg Tabs (Glyburide-Metformin) .... 2 By Mouth Two Times A Day 3)  Lantus 100 Unit/ml Soln (Insulin Glargine) .... 40 Units At At Bedtime 4)  Levoxyl 75 Mcg Tabs (Levothyroxine Sodium) .Marland Kitchen.. 1 By Mouth Once Daily 5)  Multivitamins  Tabs (Multiple Vitamin) .... Take 1 Tab Daily 6)  Enalapril-Hydrochlorothiazide 10-25 Mg Tabs (Enalapril-Hydrochlorothiazide) .... One Tablet By Mouth Once Daily 7)  Cyanocobalamin 1000 Mcg/ml Soln (Cyanocobalamin) .... Once A Month 8)  [facility] Insulin Syringe 30g X 1/2" 0.3 Ml Misc (Insulin Syringe-Needle U-100) .... As Directed 9)  Relion Insulin Syringe 30g X 5/16" 0.5 Ml Misc (Insulin Syringe-Needle U-100) .... As Directed 10)  Librax 2.5-5 Mg Caps (Clidinium-Chlordiazepoxide) .... Take 1 By Mouth Before Meals and At Bedtime 11)  Omeprazole 20 Mg Cpdr (Omeprazole) .Marland Kitchen.. 1 By Mouth Once Daily  Allergies (verified): No Known Drug Allergies  Past History:  Past Medical History: Arthritis ACUTE GI BLEED 4/11-SEVERAL DUODENAL ULCERS Adenomatous Colon Polyps-COLON 3/11 [place] TREATED FOR C.DIFF 6/11-TOX[place] NEGATIVE AODM Obesity DIARRHEA (ICD-787.91) SLEEP APNEA (ICD-780.57) SMALL BOWEL OBSTRUCTION (ICD-560.9) HYPERLIPIDEMIA (ICD-272.4) HYPERTENSION (ICD-401.9) OSTEOARTHRITIS (ICD-715.9) HYPOTHYROIDISM (ICD-244.9) DIABETES MELLITUS-TYPE II (ICD-250.00) FAMILY HX COLON CANCER (ICD-V16.0) PERSONAL HX COLONIC POLYPS (ICD-V12.72) ULCER-DUODENAL (ICD-532.9) ANEMIA-UNSPECIFIED (ICD-285.9) LOSS OF WEIGHT (ICD-783.21) INFECTIOUS DIARRHEA (ICD-009.2) DYSPHAGIA UNSPECIFIED (ICD-787.20) GERD (ICD-530.81) ACUTE POSTHEMORRHAGIC ANEMIA (ICD-285.1) ACUT DUOD ULCER W/HEMORR W/O MENTION OBSTRUCTION  (ICD-532.00) NAUSEA AND VOMITING (ICD-787.01)  Past Surgical History: Reviewed history from 01/28/2010 and no changes required. Cholecystectomy Kidney Stone Back Surgery Knee  Replacement Neck surgery  Family History: Reviewed history from 12/10/2009 and no changes required. Family History of Diabetes: mother Family History of Heart Disease: father Family History of Uterine Cancer:mother Family History of Colon Cancer:grandmother  Social History: Reviewed history from 06/04/2010 and no changes required. Retired  Married Patient has never smoked.  Alcohol Use - no Daily Caffeine Use 2 per day Illicit Drug Use - no Patient does not get regular exercise.   Review of Systems       The patient complains of arthritis/joint pain.  The patient denies allergy/sinus, anemia, anxiety-new, back pain, blood in urine, breast changes/lumps, change in vision, confusion, cough, coughing up blood, depression-new, fainting, fatigue, fever, headaches-new, hearing problems, heart murmur, heart rhythm changes, itching, muscle pains/cramps, night sweats, nosebleeds, shortness of breath, skin rash, sleeping problems, sore throat, swelling of feet/legs, swollen lymph glands, thirst - excessive, urination - excessive, urination changes/pain, urine leakage, vision changes, and voice change.    Vital Signs:  Patient profile:   77 year old male Height:      72 inches Weight:      269 pounds BMI:     36.61 BSA:     2.42 Pulse rate:   72 / minute Pulse rhythm:   regular BP sitting:   132 / 76  (left arm) Cuff size:   regular  Vitals Entered By: Ok Anis CMA (July 14, 2010 10:45 AM)  Physical Exam  General:  Well developed,obese, well nourished, no acute distress. Head:  Normocephalic and atraumatic. Eyes:  anicteric Mouth:  no thrush Lungs:  Clear throughout to auscultation. Heart:  Regular rate and rhythm; no murmurs, rubs,  or bruits. Abdomen:  Soft,obese, nontender and nondistended.  No masses, hepatosplenomegaly or hernias noted. Normal bowel sounds. Pulses:  Normal pulses noted. Extremities:  no edema Neurologic:  alert and oriented Skin:  no pallor or jaundice Psych:  Alert and cooperative. Normal mood and affect.   Impression & Recommendations:  Problem # 1:  DIARRHEA (ICD-787.91) ongoing intermittent loose urgent stools. No alarm features. Most consistent with IBS picture, likely post infectious.  Plan: #1. Stop omeprazole as this can cause diarrhea #2. Prescribed ranitidine 150 mg p.o. b.i.d. #3. Prescribed Lomotil p.r.n. diarrhea #4. He should talk with his PCP about holding metformin, as this may cause loose stools. #5. Routine GI followup in 8 weeks  Problem # 2:  NAUSEA AND VOMITING (ICD-787.01) not as problematic. Could have an element of diabetic gastroparesis. The problem worsens, consider gastric imaging scan  Problem # 3:  DYSPHAGIA (ICD-787.29) vague with negative endoscopy and esophagram. Observe  Problem # 4:  DUODENAL ULCER (ICD-532.90) previously treated endoscopically and medically  Patient Instructions: 1)  Stop Omeprazole  2)  Start taking Ranitidine 150 mg two times a day Rx. sent to pharmacy. 3)  Lomotil Rx. sent to pharmacy. take 1-2 by mouth two times a day as needed  4)  Please schedule a follow-up appointment in  8 weeks.  5)  Copy sent to : Tarri Fuller, MD  6)  The medication list was reviewed and reconciled.  All changed / newly prescribed medications were explained.  A complete medication list was provided to the patient / caregiver. Prescriptions: LOMOTIL 2.5-0.025 MG TABS (DIPHENOXYLATE-ATROPINE) take 1-2 by mouth two times a day as needed  #60 x 3   Entered by:   Milford Cage NCMA   Authorized by:   Hilarie Fredrickson MD   Signed by:   Milford Cage NCMA on 07/14/2010  Method used:   Print then Give to Patient   RxID:   [id number] RANITIDINE HCL 150 MG TABS (RANITIDINE HCL) take 1 by mouth two times a day  #60 x  6   Entered by:   Milford Cage NCMA   Authorized by:   Hilarie Fredrickson MD   Signed by:   Milford Cage NCMA on 07/14/2010   Method used:   [facility]  [address] [phone]       [address]       [place], [place]  [address]       Ph: [phone] or [phone]       Fax: [phone]   RxID:   [id number]

## 2010-09-16 NOTE — Assessment & Plan Note (Signed)
Summary: persistent diarrhea-1:15 appt./cl   History of Present Illness Visit Type: Follow-up Visit Primary GI MD: Yancey Flemings MD Primary Provider: Tarri Fuller Chief Complaint: Persistant diarrhea History of Present Illness:   77 Y.O MALE  KNOWN TO DR. PERRY WHO WAS RECENTLY HOSPITALIZED FOR A GI BLEED SECONDARY TO DUODENAL ULCERS-HE WAS SEEN IN FOLLOW UP 4/28-PLEASE REFER TO THAT NOTE FO DETAILS. HE HAS BEEN ON PRILOSEC 20 MG DAILY SINCE.  JUST PRIOR TO HIS BLEED HE HAD UNDERGONE A SCREENING COLONOSCOPY IN [place] WHICH HE SAYS WAS NEGATIVE. HE HAS DEVELOPED DIARRHEA WHICH APPARENTLY STARTED WHILE HE WAS HOSPITALIZED BUT HAS GOTTEN MUCH WORSE OVER THE PAST COUPLE WEEKS. HE IS HAVING WATERY, NONBLOODY STOOLS,AT LEAST 8 PER DAY. HE HAS URGENCY BUT NO CRAMOING OR PAIN. HE C/O WEAKNESS, AND HAS LOST 20 POUNDS SINCE HOSPITALIZED. HE IS NOT EATING MUCH BECAUSE HE DOESN'T KNOW WHAT HE IS SUPPOSED TO EAT, AND EVERYTHING HE EATS GOES THRU HIM.NO FEVERS, NO RECENT ANTIBIOTICS,ONLY NEW MED IS THE PPI.   GI Review of Systems    Reports weight loss.   Weight loss of 20 pounds   Denies abdominal pain, acid reflux, belching, bloating, chest pain, dysphagia with liquids, dysphagia with solids, heartburn, loss of appetite, nausea, vomiting, vomiting blood, and  weight gain.        Denies anal fissure, black tarry stools, change in bowel habit, constipation, diarrhea, diverticulosis, fecal incontinence, heme positive stool, hemorrhoids, irritable bowel syndrome, jaundice, light color stool, liver problems, rectal bleeding, and  rectal pain.    Current Medications (verified): 1)  Gemfibrozil 600 Mg Tabs (Gemfibrozil) .Marland Kitchen.. 1 By Mouth Twice Daily 2)  Glyburide-Metformin 2.5-500 Mg Tabs (Glyburide-Metformin) .... 2 By Mouth Two Times A Day 3)  Lantus 100 Unit/ml Soln (Insulin Glargine) .... 40 Units At At Bedtime 4)  Levoxyl 75 Mcg Tabs (Levothyroxine Sodium) .Marland Kitchen.. 1 By Mouth Once Daily 5)  Ferrous  Fumarate 325 (106 Fe) Mg Tabs (Ferrous Fumarate) .... Take 1 Tab At Bedtime 6)  Multivitamins  Tabs (Multiple Vitamin) .... Take 1 Tab Daily 7)  Omeprazole 20 Mg Cpdr (Omeprazole) .... Take 1 Tab 30 Min Before Breakfast  Allergies (verified): No Known Drug Allergies   Past History:  Past Medical History: Arthritis ACUTE GI BLEED 4/11-SEVERAL DUODENAL ULCERS Adenomatous Colon Polyps-COLON 3/11 [place] Diabetes HYPOTHYROIDISM Hyperlipidemia Hypertension Obesity Sleep Apnea  Past Surgical History: Reviewed history from 12/10/2009 and no changes required. Cholecystectomy  Family History: Reviewed history from 12/10/2009 and no changes required. Family History of Diabetes: mother Family History of Heart Disease: father Family History of Uterine Cancer:mother Family History of Colon Cancer:grandmother  Social History: Reviewed history from 12/10/2009 and no changes required. Patient has never smoked.  Alcohol Use - no Daily Caffeine Use 2 per day Illicit Drug Use - no Patient does not get regular exercise.   Review of Systems  The patient denies allergy/sinus, anemia, anxiety-new, arthritis/joint pain, back pain, blood in urine, breast changes/lumps, change in vision, confusion, cough, coughing up blood, depression-new, fainting, fatigue, fever, headaches-new, hearing problems, heart murmur, heart rhythm changes, itching, menstrual pain, muscle pains/cramps, night sweats, nosebleeds, pregnancy symptoms, shortness of breath, skin rash, sleeping problems, sore throat, swelling of feet/legs, swollen lymph glands, thirst - excessive , urination - excessive , urination changes/pain, urine leakage, vision changes, and voice change.         ROS OTHERWISE AS IN HPI  Vital Signs:  Patient profile:   77 year old male Height:  72 inches Weight:      258 pounds BMI:     35.12 Pulse rate:   72 / minute Pulse rhythm:   regular BP sitting:   118 / 64  (right arm)  Vitals  Entered By: Lowry Ram NCMA (January 13, 2010 1:33 PM)  Physical Exam  General:  Well developed, well nourished, no acute distress. Head:  Normocephalic and atraumatic. Eyes:  PERRLA, no icterus. Lungs:  Clear throughout to auscultation. Heart:  Regular rate and rhythm; no murmurs, rubs,  or bruits. Abdomen:  LARGE SOFT, MILD TENDERNESS RLQ, NO MASS OR HSM,BS ACTIVE Rectal:  NOT DONE Extremities:  No clubbing, cyanosis, edema or deformities noted. Neurologic:  Alert and  oriented x4;  grossly normal neurologically. Psych:  Alert and cooperative. Normal mood and affect.   Impression & Recommendations:  Problem # 1:  DIARRHEA-PRESUMED INFECTIOUS (ICD-009.3) Assessment New 76 YO MALE WITH RECENT GI BLEED SECONDARY TO DUODENAL ULCERS WITH NEW C/O DIARRHEA-PROGRESSIVE OVER THE PAST FEW WEEKS. SUSPECT INFECTIOUS-R/O C.DIFF  (RECENT HOSPITAL EXPOSURE,AND COLONOSCOPY),CONSIDER MED INDUCED-PPI THOUGH LESS LIKELY.  LABS TODAY AS BELOW STOOL STUDIES CONTINUE PRILOSEC 20 MG DAILY N AM START FLAGYL 250 MG ;4 X DAILY X 14 DAYS START FLORASTOR ONE TWICE DAILY X 2 WEEKS PUSH ORAL FLUIDS, AND REGULAR LOWER ROUGHAGE DIET FOLLOW UP IN OFFICE IN 2 WEEKS-HE WAS ADVISED TO CALL IF HIS SXS WORSEN IN THE INTERIM WILL OBTAIN A COPY OF HIS COLONOSCOPY DONE IN [place]  Problem # 2:  ULCER-DUODENAL (ICD-532.9) Assessment: Comment Only MULTIPLE ULCERS,NSAID INDUCED.  CONTINUE PRILOSEC OR OTHER PPI LONG TERM.  Problem # 3:  ANEMIA-UNSPECIFIED (ICD-285.9) Assessment: Comment Only POST HEMORRHAGIC- CHECK F/U HGB TODAY.  Problem # 4:  DIABETES MELLITUS-TYPE II (ICD-250.00) Assessment: Comment Only  Other Orders: TLB-BMP (Basic Metabolic Panel-BMET) (80048-METABOL) TLB-CBC Platelet - w/Differential (85025-CBCD) T-Culture, Stool (87045/[id number]) T-Culture, C-Diff Toxin A/B ([address]) T-Stool for O&P (09811-91478) T-Fecal WBC (29562-13086)  Patient Instructions: 1)  Your physician has  requested that you have the following labwork done today: Go to basement level. 2)  We sent perscriptions for Flagyl ( Metronidazole) and Florastor capsules to the pharmacy.  Samples given. 3)  We have made you a follow up appointment with Mike Gip PA on 01/28/10 at 1:30 PM.  Appt card given. 4)  Copy sent to : Tarri Fuller, MD 5)  The medication list was reviewed and reconciled.  All changed / newly prescribed medications were explained.  A complete medication list was provided to the patient / caregiver. Prescriptions: FLORASTOR 250 MG CAPS (SACCHAROMYCES BOULARDII) Take 1 tab twice daily x 14 days  #14 x 0   Entered by:   Lowry Ram NCMA   Authorized by:   Sammuel Cooper PA-c   Signed by:   Lowry Ram NCMA on 01/13/2010   Method used:   Electronically to        [facility]  [address] [address] [phone] (retail)       [address]       [place], [place]  [address]       Ph: [phone] or [phone]       Fax: [phone]   RxID:   [id number] METRONIDAZOLE 250 MG TABS (METRONIDAZOLE) Take 1 tab 4 times daily x 14 days  #56 x 0   Entered by:   Lowry Ram NCMA   Authorized by:   Sammuel Cooper PA-c   Signed by:  Pam Peterman NCMA on 01/13/2010   Method used:   Electronically to        [facility]  [address] [phone]* (retail)       [address]       [place], [place]  [address]       Ph: [phone] or [phone]       Fax: [phone]   RxID:   [id number]

## 2010-09-16 NOTE — Procedures (Signed)
Summary: Upper Endoscopy  Patient: [name] Note: All result statuses are Final unless otherwise noted.  Tests: (1) Upper Endoscopy (EGD)   EGD Upper Endoscopy       DONE     [facility]     [address]     [place], [place]  [address]           ENDOSCOPY PROCEDURE REPORT           PATIENT:  Chad, Carter  MR#:  [id number]     BIRTHDATE:  [birth year], 76 yrs. old  GENDER:  male           ENDOSCOPIST:  Wilhemina Bonito. Eda Keys, MD     Referred by:  [facility]           PROCEDURE DATE:  11/05/2009     PROCEDURE:  EGD with biopsy,     EGD for control of bleeding     ASA CLASS:  Class II     INDICATIONS:  melena/ hematochezia ; Dysphagia           MEDICATIONS:   Fentanyl 50 mcg IV, Versed 4 mg IV     TOPICAL ANESTHETIC:  Cetacaine Spray           DESCRIPTION OF PROCEDURE:   After the risks benefits and     alternatives of the procedure were thoroughly explained, informed     consent was obtained.  The [id number] ([id number]) endoscope was     introduced through the mouth and advanced to the second portion of     the duodenum, without limitations.  The instrument was slowly     withdrawn as the mucosa was fully examined.     <<PROCEDUREIMAGES>>           The upper, middle, and distal third of the esophagus were     carefully inspected and no abnormalities were noted. The z-line     was well seen at the GEJ. The endoscope was pushed into the fundus     which was normal including a retroflexed view. The antrum     andgastric body were unremarkable.  A 5mm clean  based ulcer  and     5mm punctate ulcer w/ stigmata were found in the bulb of the     duodenum. The post bulbar duodenum was normal.   THERAPY: 1.5 CC     EPINEPHRINE {1:10,000} WAS INJECTED INTO THE CULPRIT ULCER. THIS     WAS SUBSEQUENTLY CLOSED W/ ONE ENDO CLIP. RUT (CLO) BX OF GASTRIC     ANTRUM TAKEN.    The scope was then withdrawn from the patient and     the procedure completed.           COMPLICATIONS:  None           ENDOSCOPIC IMPRESSION:     1) Ulcers in the bulb of duodenum - s/p Endoscopi hemostaic     therapy     2) Otherwise normal EGD           RECOMMENDATIONS:     1) PPI bid     2) Rx CLO if positive     3) Avoid NSAIDS     4) Monitor for rebleed           ______________________________     Wilhemina Bonito. Eda Keys, MD           CC:  The Patient; Renee Rival  n.     eSIGNED:   Wilhemina Bonito. Eda Keys at 11/05/2009 03:39 PM           Belva Bertin, [id number]  Note: An exclamation mark (!) indicates a result that was not dispersed into the flowsheet. Document Creation Date: 11/06/2009 9:08 AM _______________________________________________________________________  (1) Order result status: Final Collection or observation date-time: 11/05/2009 15:19 Requested date-time:  Receipt date-time:  Reported date-time:  Referring Physician:   Ordering Physician: Fransico Setters [phone]) Specimen Source:  Source: Launa Grill Order Number: [id number] Lab site:

## 2010-09-16 NOTE — Letter (Signed)
Summary: New Patient letter  [facility]  [address] [place], [place] [address]   Phone: [phone]  Fax: [phone]       11/09/2009 MRN: [id number]  Chad Carter [address] [place], [place]  [address]  [place]  Dear Mr. Chad Carter,  Welcome to the Gastroenterology Division at [facility].    You are scheduled to see Dr.   Marina Goodell on 12-10-09 at 11am on the 3rd floor at [facility], [address]  We ask that you try to arrive at our office 15 minutes prior to your appointment time to allow for check-in.  We would like you to complete the enclosed self-administered evaluation form prior to your visit and bring it with you on the day of your appointment.  We will review it with you.  Also, please bring a complete list of all your medications or, if you prefer, bring the medication bottles and we will list them.  Please bring your insurance card so that we may make a copy of it.  If your insurance requires a referral to see a specialist, please bring your referral form from your primary care physician.  Co-payments are due at the time of your visit and may be paid by cash, check or credit card.     Your office visit will consist of a consult with your physician (includes a physical exam), any laboratory testing he/she may order, scheduling of any necessary diagnostic testing (e.g. x-ray, ultrasound, CT-scan), and scheduling of a procedure (e.g. Endoscopy, Colonoscopy) if required.  Please allow enough time on your schedule to allow for any/all of these possibilities.    If you cannot keep your appointment, please call [phone] to cancel or reschedule prior to your appointment date.  This allows [place] the opportunity to schedule an appointment for another patient in need of care.  If you do not cancel or reschedule by 5 p.m. the business day prior to your appointment date, you will be charged a $50.00 late cancellation/no-show fee.    Thank you for choosing  [facility] for your medical needs.  We appreciate the opportunity to care for you.  Please visit [place] at our website  to learn more about our practice.                     Sincerely,                                                             The Gastroenterology Division

## 2010-09-16 NOTE — Assessment & Plan Note (Signed)
Summary: HOSPITAL FOLLOWUP-bleeding ulcer   History of Present Illness Visit Type: Initial Visit Primary GI MD: Yancey Flemings MD Primary Provider: Tarri Fuller MD Chief Complaint: 4 week hosp f/u - bleeding ulcer History of Present Illness:   77 year old white male with hypertension, hyperlipidemia, morbid obesity, osteoarthritis, sleep apnea, and diabetes mellitus. He presents today for post hospital followup. He was hospitalized November 04, 2009 with acute GI bleeding. He underwent upper endoscopy and was found to have several duodenal ulcers, one of which required endoscopic hemostatic therapy. Testing for Helicobacter pylori was negative. His risk factor for ulcer disease was taking Aleve at least weekly. He was discharged home on pantoprazole. He took a prescription for one month but did not refill at 2 insurance coverage issues. He has been off NSAIDs. No recurrent bleeding. No new GI complaints. He has undergone screening colonoscopy in [facility] within the past few months. He is accompanied today by his wife. His workup, findings, and treatment to date have been reviewed. Multiple questions answered.Marland Kitchen He does have chronic GERD with pyrosis that has responded to PPI therapy. Recurrent symptoms off medication. Also chronic problems with dysphagia to liquids greater than solids. Has had previous workup elsewhere without cause found. No esophageal abnormalities on endoscopy.   GI Review of Systems    Reports acid reflux, dysphagia with liquids, and  dysphagia with solids.      Denies abdominal pain, belching, bloating, chest pain, heartburn, loss of appetite, nausea, vomiting, vomiting blood, weight loss, and  weight gain.      Reports diarrhea and  diverticulosis.     Denies anal fissure, black tarry stools, change in bowel habit, constipation, fecal incontinence, heme positive stool, hemorrhoids, irritable bowel syndrome, jaundice, light color stool, liver problems, rectal  bleeding, and  rectal pain. Preventive Screening-Counseling & Management  Alcohol-Tobacco     Smoking Status: never  Caffeine-Diet-Exercise     Does Patient Exercise: no      Drug Use:  no.      Current Medications (verified): 1)  Gemfibrozil 600 Mg Tabs (Gemfibrozil) .Marland Kitchen.. 1 By Mouth Once Daily 2)  Glyburide-Metformin 2.5-500 Mg Tabs (Glyburide-Metformin) .... 2 By Mouth Two Times A Day 3)  Lantus 100 Unit/ml Soln (Insulin Glargine) .... 40 Units At At Bedtime 4)  Levoxyl 75 Mcg Tabs (Levothyroxine Sodium) .Marland Kitchen.. 1 By Mouth Once Daily 5)  Welchol 625 Mg Tabs (Colesevelam Hcl) .Marland Kitchen.. 1 By Mouth At Bedtime  Allergies (verified): No Known Drug Allergies  Past History:  Past Medical History: Arthritis Adenomatous Colon Polyps Diabetes Hyperlipidemia Hypertension Obesity Sleep Apnea  Past Surgical History: Cholecystectomy  Family History: Family History of Diabetes: mother Family History of Heart Disease: father Family History of Uterine Cancer:mother Family History of Colon Cancer:grandmother  Social History: Patient has never smoked.  Alcohol Use - no Daily Caffeine Use 2 per day Illicit Drug Use - no Patient does not get regular exercise.  Smoking Status:  never Drug Use:  no Does Patient Exercise:  no  Review of Systems       The patient complains of arthritis/joint pain.  The patient denies allergy/sinus, anemia, anxiety-new, back pain, blood in urine, breast changes/lumps, change in vision, confusion, cough, coughing up blood, depression-new, fainting, fatigue, fever, headaches-new, hearing problems, heart murmur, heart rhythm changes, itching, menstrual pain, muscle pains/cramps, night sweats, nosebleeds, pregnancy symptoms, shortness of breath, skin rash, sleeping problems, sore throat, swelling of feet/legs, swollen lymph glands, thirst - excessive , urination - excessive , urination  changes/pain, urine leakage, vision changes, and voice change.    Vital  Signs:  Patient profile:   77 year old male Height:      72 inches Weight:      263.50 pounds BMI:     35.87 Pulse rate:   70 / minute Pulse rhythm:   regular BP sitting:   146 / 66  (left arm)  Vitals Entered By: Chales Abrahams CMA Duncan Dull) (December 10, 2009 11:01 AM)  Physical Exam  General:  Well developed, obese,well nourished, no acute distress. Head:  Normocephalic and atraumatic. Eyes:  PERRLA, no icterus. Mouth:  No deformity or lesions Lungs:  Clear throughout to auscultation. Heart:  Regular rate and rhythm; no murmurs, rubs,  or bruits. Abdomen:  Soft, obese,nontender and nondistended. No masses, hepatosplenomegaly or hernias noted. Normal bowel sounds. Msk:  osteoarthritis changes Pulses:  normal pulses Extremities:  no edema Neurologic:  Alert and  oriented x4;   Skin:  no jaundice Psych:  Alert and cooperative. Normal mood and affect.   Impression & Recommendations:  Problem # 1:  ACUT DUOD ULCER W/HEMORR W/O MENTION OBSTRUCTION (ICD-532.00) recent acute GI bleed secondary to NSAID induced duodenal ulcer status post endoscopic hemostatic therapy.  Plan: #1. Recommended over-the-counter omeprazole or Prilosec OTC 20 mg daily indefinitely. This, to reduce the risk of recurrent ulcer and treat chronic GERD. #2. Avoid unnecessary NSAIDs. Tylenol okay for pain in recommended dosages. #3. GI followup p.r.n.  Problem # 2:  GERD (ICD-530.81) chronic GERD.  Plan: #1. Daily PPI therapy #2. Reflux precautions  Problem # 3:  DYSPHAGIA UNSPECIFIED (ICD-787.20) intermittent dysphagia to liquids and solids. Suspect a formal dysmotility. No esophageal abnormality on endoscopy. Problem is stable and not worsening. no new recommendations  Problem # 4:  ACUTE POSTHEMORRHAGIC ANEMIA (ICD-285.1) posthemorrhagic anemia with hemoglobin 8.4 at discharge.  Plan:  #1. Followup CBC today #2. Hemoglobin returned. 10+. Patient notified and advised to take multivitamin with  iron  Other Orders: TLB-CBC Platelet - w/Differential (85025-CBCD)  Patient Instructions: 1)  Labs ordered for you to have drawn today on  basement floor. 2)  Continue taking Prilosec OTC 20mg  once daily 3)  The medication list was reviewed and reconciled.  All changed / newly prescribed medications were explained.  A complete medication list was provided to the patient / caregiver. 4)  printed and given to patient. Milford Cage [facility]  December 10, 2009 11:33 AM 5)  copy: Dr. Tarri Fuller

## 2010-11-07 LAB — BASIC METABOLIC PANEL
BUN: 55 mg/dL — ABNORMAL HIGH (ref 6–23)
CO2: 19 mEq/L (ref 19–32)
CO2: 20 mEq/L (ref 19–32)
Chloride: 113 mEq/L — ABNORMAL HIGH (ref 96–112)
Chloride: 113 mEq/L — ABNORMAL HIGH (ref 96–112)
Creatinine, Ser: 1.84 mg/dL — ABNORMAL HIGH (ref 0.4–1.5)
GFR calc Af Amer: 34 mL/min — ABNORMAL LOW (ref >60)
Glucose, Bld: 130 mg/dL — ABNORMAL HIGH (ref 70–99)
Glucose, Bld: 151 mg/dL — ABNORMAL HIGH (ref 70–99)
Potassium: 4.5 mEq/L (ref 3.5–5.1)
Potassium: 5.3 mEq/L — ABNORMAL HIGH (ref 3.5–5.1)
Sodium: 141 mEq/L (ref 135–145)

## 2010-11-07 LAB — HEMOCCULT GUIAC POC 1CARD (OFFICE): Fecal Occult Bld: POSITIVE

## 2010-11-07 LAB — COMPREHENSIVE METABOLIC PANEL
ALT: 14 U/L (ref 0–53)
ALT: 19 U/L (ref 0–53)
AST: 16 U/L (ref 0–37)
AST: 21 U/L (ref 0–37)
Albumin: 3.2 g/dL — ABNORMAL LOW (ref 3.5–5.2)
Albumin: 3.6 g/dL (ref 3.5–5.2)
Alkaline Phosphatase: 97 U/L (ref 39–117)
BUN: 67 mg/dL — ABNORMAL HIGH (ref 6–23)
CO2: 18 mEq/L — ABNORMAL LOW (ref 19–32)
CO2: 21 mEq/L (ref 19–32)
Calcium: 8.4 mg/dL (ref 8.4–10.5)
Calcium: 8.7 mg/dL (ref 8.4–10.5)
Chloride: 108 mEq/L (ref 96–112)
Chloride: 112 mEq/L (ref 96–112)
Creatinine, Ser: 2.14 mg/dL — ABNORMAL HIGH (ref 0.4–1.5)
Creatinine, Ser: 2.39 mg/dL — ABNORMAL HIGH (ref 0.4–1.5)
GFR calc Af Amer: 32 mL/min — ABNORMAL LOW (ref >60)
GFR calc Af Amer: 37 mL/min — ABNORMAL LOW (ref >60)
GFR calc non Af Amer: 27 mL/min — ABNORMAL LOW (ref >60)
GFR calc non Af Amer: 30 mL/min — ABNORMAL LOW (ref >60)
Glucose, Bld: 242 mg/dL — ABNORMAL HIGH (ref 70–99)
Potassium: 5.7 mEq/L — ABNORMAL HIGH (ref 3.5–5.1)
Sodium: 139 mEq/L (ref 135–145)
Sodium: 141 mEq/L (ref 135–145)
Total Bilirubin: 0.5 mg/dL (ref 0.3–1.2)
Total Bilirubin: 0.6 mg/dL (ref 0.3–1.2)
Total Protein: 6.7 g/dL (ref 6.0–8.3)

## 2010-11-07 LAB — GLUCOSE, CAPILLARY
Glucose-Capillary: 115 mg/dL — ABNORMAL HIGH (ref 70–99)
Glucose-Capillary: 132 mg/dL — ABNORMAL HIGH (ref 70–99)
Glucose-Capillary: 139 mg/dL — ABNORMAL HIGH (ref 70–99)
Glucose-Capillary: 142 mg/dL — ABNORMAL HIGH (ref 70–99)
Glucose-Capillary: 163 mg/dL — ABNORMAL HIGH (ref 70–99)
Glucose-Capillary: 168 mg/dL — ABNORMAL HIGH (ref 70–99)
Glucose-Capillary: 173 mg/dL — ABNORMAL HIGH (ref 70–99)
Glucose-Capillary: 176 mg/dL — ABNORMAL HIGH (ref 70–99)
Glucose-Capillary: 213 mg/dL — ABNORMAL HIGH (ref 70–99)

## 2010-11-07 LAB — RETICULOCYTES
RBC.: 2.5 MIL/uL — ABNORMAL LOW (ref 4.22–5.81)
Retic Count, Absolute: 35 10*3/uL (ref 19.0–186.0)
Retic Ct Pct: 1.4 % (ref 0.4–3.1)

## 2010-11-07 LAB — TYPE AND SCREEN
ABO/RH(D): A POS
Antibody Screen: NEGATIVE

## 2010-11-07 LAB — CBC
HCT: 25.6 % — ABNORMAL LOW (ref 39.0–52.0)
HCT: 26.2 % — ABNORMAL LOW (ref 39.0–52.0)
Hemoglobin: 8.4 g/dL — ABNORMAL LOW (ref 13.0–17.0)
Hemoglobin: 9.3 g/dL — ABNORMAL LOW (ref 13.0–17.0)
MCHC: 34.6 g/dL (ref 30.0–36.0)
MCHC: 34.8 g/dL (ref 30.0–36.0)
MCHC: 35.6 g/dL (ref 30.0–36.0)
MCV: 95.1 fL (ref 78.0–100.0)
MCV: 98.2 fL (ref 78.0–100.0)
Platelets: 150 10*3/uL (ref 150–400)
Platelets: 151 10*3/uL (ref 150–400)
Platelets: 186 10*3/uL (ref 150–400)
Platelets: 213 10*3/uL (ref 150–400)
RBC: 2.67 MIL/uL — ABNORMAL LOW (ref 4.22–5.81)
RBC: 2.94 MIL/uL — ABNORMAL LOW (ref 4.22–5.81)
RDW: 13.7 % (ref 11.5–15.5)
RDW: 16 % — ABNORMAL HIGH (ref 11.5–15.5)
RDW: 16 % — ABNORMAL HIGH (ref 11.5–15.5)
WBC: 5.2 10*3/uL (ref 4.0–10.5)
WBC: 6.9 10*3/uL (ref 4.0–10.5)

## 2010-11-07 LAB — DIFFERENTIAL
Basophils Absolute: 0 10*3/uL (ref 0.0–0.1)
Basophils Relative: 1 % (ref 0–1)
Eosinophils Absolute: 0.1 10*3/uL (ref 0.0–0.7)
Eosinophils Absolute: 0.1 10*3/uL (ref 0.0–0.7)
Eosinophils Relative: 1 % (ref 0–5)
Eosinophils Relative: 2 % (ref 0–5)
Lymphocytes Relative: 13 % (ref 12–46)
Lymphocytes Relative: 27 % (ref 12–46)
Lymphs Abs: 0.9 10*3/uL (ref 0.7–4.0)
Lymphs Abs: 1.4 10*3/uL (ref 0.7–4.0)
Monocytes Absolute: 0.4 10*3/uL (ref 0.1–1.0)
Monocytes Absolute: 0.4 10*3/uL (ref 0.1–1.0)
Monocytes Relative: 5 % (ref 3–12)
Neutro Abs: 5.6 10*3/uL (ref 1.7–7.7)
Neutrophils Relative %: 81 % — ABNORMAL HIGH (ref 43–77)

## 2010-11-07 LAB — PROTIME-INR
INR: 1.19 (ref 0.00–1.49)
Prothrombin Time: 15 seconds (ref 11.6–15.2)

## 2010-11-07 LAB — FERRITIN: Ferritin: 190 ng/mL (ref 22–322)

## 2010-11-07 LAB — APTT: aPTT: 35 seconds (ref 24–37)

## 2010-11-07 LAB — FOLATE: Folate: 16.7 ng/mL

## 2010-11-07 LAB — CLOTEST (H. PYLORI), BIOPSY: Helicobacter screen: NEGATIVE

## 2010-11-07 LAB — IRON AND TIBC
Iron: 117 ug/dL (ref 42–135)
Saturation Ratios: 39 % (ref 20–55)
TIBC: 301 ug/dL (ref 215–435)
UIBC: 184 ug/dL

## 2010-11-07 LAB — HEMOGLOBIN A1C: Hgb A1c MFr Bld: 6.5 % — ABNORMAL HIGH (ref 4.6–6.1)

## 2010-11-07 LAB — VITAMIN B12: Vitamin B-12: 180 pg/mL — ABNORMAL LOW (ref 211–911)

## 2010-12-28 NOTE — Op Note (Signed)
NAME:  Chad Carter, Chad Carter NO.:  [id number]   MEDICAL RECORD NO.:  [id number]          PATIENT TYPE:  INP   LOCATION:  5035                         FACILITY:  [facility]   PHYSICIAN:  Feliberto Gottron. Turner Daniels, M.D.   DATE OF BIRTH:  [birth year]   DATE OF PROCEDURE:  10/08/2007  DATE OF DISCHARGE:                               OPERATIVE REPORT   PREOPERATIVE DIAGNOSIS:  End-stage arthritis.   States never mind, already dictated this one.      Feliberto Gottron. Turner Daniels, M.D.     Ovid Curd  D:  10/08/2007  T:  10/08/2007  Job:  [id number]

## 2010-12-28 NOTE — Op Note (Signed)
NAMEMarland Kitchen  SIDDHARTHA, HOBACK NO.:  [id number]   MEDICAL RECORD NO.:  [id number]          PATIENT TYPE:  INP   LOCATION:  2899                         FACILITY:  [facility]   PHYSICIAN:  Feliberto Gottron. Turner Daniels, M.D.   DATE OF BIRTH:  11/10/1933   DATE OF PROCEDURE:  10/08/2007  DATE OF DISCHARGE:                               OPERATIVE REPORT   PREOPERATIVE DIAGNOSIS:  End-stage arthritis, left knee, with 10-degree  varus deformity.   POSTOPERATIVE DIAGNOSIS:  End-stage arthritis, left knee, with 10-degree  varus deformity.   PROCEDURE:  Left total knee arthroplasty using DePuy sigma RP  components, all cemented, 5 femur, 5 tibia, 41-mm patella 10-mm sigma RP  spacer.   SURGEON:  Feliberto Gottron.  Turner Daniels, MD.   FIRST ASSISTANT:  Laural Benes. Jannet Mantis.   ANESTHETIC:  General endotracheal.   ESTIMATED BLOOD LOSS:  Minimal.   FLUID REPLACEMENT:  1500 mL crystalloid.   DRAINS PLACED:  Foley catheter and two medium Hemovac.   URINE OUTPUT:  300 mL.   INDICATIONS FOR PROCEDURE:  The patient is a 77 year old gentleman with  end-stage arthritis of both knees, left worse than right.  Has failed  conservative treatment with anti-inflammatory medicines, physical  therapy, cortisone injections.  Plain radiographs showed bone-on-bone  changes.  He has trouble ambulating more than about a block before he  has to hold onto something or sit down.  Pain wakes him up at night and  he has also failed the use of mild narcotics.  He desires elective left  total knee arthroplasty.  Risks and benefits of surgery discussed,  questions answered.   DESCRIPTION OF PROCEDURE:  The patient identified by armband and  underwent left femoral nerve block, anesthesia in the block area at [facility] and also received 2 g of Ancef in the preoperative area.  He  was then taken to operating room #15, where the appropriate anesthetic  monitors were attached and general endotracheal anesthesia induced with  the  patient in supine position.  A Foley catheter was inserted.  A  tourniquet was applied high to the left thigh.  A foot positioner and  lateral post were applied and then the left lower extremity prepped and  draped in the usual sterile fashion from the ankle to the tourniquet.  The limb was wrapped with an Esmarch bandage, the knee flexed,  tourniquet inflated to 350 mmHg and we began the procedure by making an  anterior midline incision starting a handbreadth above the patella,  going over the patella 1 cm medial to and 3 cm distal to the tibial  tubercle.  Small bleeders in the skin and subcutaneous tissue identified  and cauterized.  We cut down to the transverse retinaculum of the  patella, reflected it medially, and then accomplished a medial  parapatellar arthrotomy.  The prepatellar fat pad was resected.  The  superficial medial collateral ligament was elevated off the anterior  aspect of the tibia and left intact distally and peeled around to the  posteromedial corner using the electrocautery.  The patella was everted.  The knee was hyperflexed.  Peripheral and notch osteophytes were removed  from around the femur, allowing resection of the cruciate ligaments with  the electrocautery and the anterior one half of the menisci.  A  posteromedial Z retractor was then placed, a McCale retractor through  the notch, and a lateral Hohmann retractor protecting the proximal  tibia.  We then entered the tibia with the step drill from DePuy in line  with the shaft of the tibia, followed by the intramedullary rod and a 0-  degrees posterior slope cutting guide.  This was set up so that we would  remove about 8 mm of bone medially and 10 mm of bone laterally and the  proximal tibial cut was accomplished without difficulty.  We then  entered the distal femur 2 mm anterior to the PCL origin with a step  drill, followed by the IM rod set to 5 degrees left, and performed our  distal femoral  resection, removing 12 mm of the distal femoral bone and  then sizing for a #5 left femoral component.  This was pinned into place  in 3 degrees of external rotation because of the varus deformity and  then the #5 cutting guide hammered into the holes left by the pins.  We  performed our anterior-posterior and chamfer cuts without difficulty,  followed by the PS box cut.  The patella was then measured at 28 mm.  We  sized for a 41 button, resecting 11 mm of the posterior patella with the  cutting guide, and then drilled the 41-mm trial lollipop.  The knee was  once again hyperflexed, exposing the proximal tibia.  We sized for a #5  tibial base plate, pinned it in place, attached the smokestack reamer  and did our conical reaming, followed by the Delta fin keel punch with  the center bullet.  A 5 left trial femoral component was then hammered  onto the femur and the lugs drilled.  We inserted a 10-mm PS trial  spacer.  The knee was brought to full extension and reduction and flexed  to 130 degrees.  The trial 41-mm patella tracked without any thumb  pressure and at this point the trial components were removed.  All bony  surfaces were water-picked clean and dried with suction and sponges.  A  double batch of DePuy HV cement with 1500 mg Zinacef was mixed at the  back table and applied to all bony and metallic mating surfaces except  for the posterior condyles of the femur itself.  In order, we hammered  into place a #5 tibial baseplate, a 5 left femoral component, and  removed excess cement each time, followed by the 10-mm sigma RP spacer  and a 41-mm patellar button.  This was squeezed into place and held with  a clamp until the cement hardened and excess cement was removed with  curettes.  The knee was then checked one more time for stability, going  from full extension to 135 of flexion with no impingement noted.  Medium  Hemovac drains were placed deep in the wound, which was then  water-  picked clean.  The parapatellar arthrotomy was closed with running #1  Vicryl suture, the subcutaneous tissue with 0 and 2-0 undyed Vicryl  suture, and skin with skin staples.  A dressing of Xeroform 4x4 dressing  sponges, Webril and an Ace wrap applied.  Tourniquet let down.  The  patient awakened and taken to the recovery room without difficulty.      Homero Fellers  Tawni Carnes, M.D.  Electronically Signed     FJR/MEDQ  D:  10/08/2007  T:  10/09/2007  Job:  [id number]

## 2010-12-31 NOTE — Op Note (Signed)
NAME:  Chad Carter, Chad Carter                       ACCOUNT NO.:  [id number]   MEDICAL RECORD NO.:  [id number]                   PATIENT TYPE:  OIB   LOCATION:  NA                                   FACILITY:  [facility]   PHYSICIAN:  Robert A. Thurston Hole, M.D.              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   OPERATION PERFORMED:  1. Right knee examination under anesthesia followed by arthroscopic partial     medial and lateral meniscectomies.  2. Right knee chondroplasty and partial synovectomy.   SURGEON:  Elana Alm. Thurston Hole, M.D.   ASSISTANT:  Julien Girt, P.A.   ANESTHESIA:  General.   OPERATIVE TIME:  40 minutes.   COMPLICATIONS:  None.   DESCRIPTION OF PROCEDURE:  Chad Carter was brought to the operating room on  July 21, 2003, placed on the operating table in supine position.  After  an adequate level of general anesthesia was obtained, his right knee was  examined under anesthesia.  Range of motion 0 to 125 degrees.  Mild varus  deformity.  Knee stable to ligamentous exam with normal patella tracking.  Right leg was prepped using sterile DuraPrep and draped using sterile  technique and was injected sterilely with 0.25% Marcaine with epinephrine.  Initially, the arthroscopy was performed through an inferolateral portal.  The arthroscope with a pump attached was placed and through an inferomedial  portal an arthroscopic probe was placed.  On initial inspection in the  medial compartment, the articular cartilage showed 75 to 80% grade 3  chondromalacia which was debrided.  He had complex tearing in the  posteromedial and anterior horn of the medial meniscus 50% which  was  resected back to a stable rim.  The intercondylar notch was inspected.  The  anterior and posterior cruciate ligaments were normal.  The lateral  compartment inspected.  Mild grade 1 to 2 chondromalacia.  He had 40% grade  3 changes on the lateral tibial plateau which was debrided.  The lateral  meniscus showed tearing of 20% of posterior and lateral horn which was  resected back to a stable rim.  Patellofemoral joint showed 50% grade 3  chondromalacia on the patellofemoral groove which was debrided.  The patella  tracked normally.  Moderate synovitis in the medial and lateral gutters was  debrided.  Otherwise they were free of pathology.  After this was done it  was felt that all pathology had been satisfactorily addressed.  The  instruments were removed.  Portals were closed with 3-0 nylon suture and  injected with 0.25% Marcaine with epinephrine and 4 mg of morphine.  Sterile  dressings were applied  and the patient awakened and taken to recovery room  in stable condition.   FOLLOW UP:  Chad Carter will be followed overnight at the step down unit due  to his sleep apnea.  He will be discharged in the next 24 hours if stable.  We will see him back in the office in a week for suture removal and follow  up.                                               Robert A. Thurston Hole, M.D.    RAW/MEDQ  D:  07/21/2003  T:  07/21/2003  Job:  [id number]

## 2010-12-31 NOTE — Discharge Summary (Signed)
NAME:  Chad Carter, Chad Carter NO.:  [id number]   MEDICAL RECORD NO.:  [id number]          PATIENT TYPE:  INP   LOCATION:  5035                         FACILITY:  [facility]   PHYSICIAN:  Feliberto Gottron. Turner Daniels, M.D.   DATE OF BIRTH:  September 25, 1933   DATE OF ADMISSION:  10/08/2007  DATE OF DISCHARGE:  10/12/2007                               DISCHARGE SUMMARY   FINAL DIAGNOSIS:  End-stage degenerative joint disease of the left knee.   PROCEDURE:  While in the hospital, left total knee arthroplasty.   DISCHARGE SUMMARY:  The patient is a 77 year old gentleman with end-  stage arthritis of left knee, left worse than right, he has failed  conservative treatment with antiinflammatory medications, physical  therapy, cortisone injections.  Plane radiographs have shown bone-on-  bone arthriticchanges.  He has had trouble ambulating more than about a  block before he has to rest or hold on to someone's arm.  Pain awakens  him at night, and he has also failed the use of mild narcotics.  He  desires elective left total knee arthroplasty after discussing risks and  benefits of the surgery.   ALLERGIES:  No known drug allergies.   MEDICATIONS AT THE TIME OF ADMISSION:  1. Enalapril and hydrochlorothiazide 10/25 mg daily.  2. Gemfibrozil 600 mg twice daily.  3. Glyburide and metformin 2.5/500 two tablets twice daily.  4. Lantus 40 units subcu at bedtime.  5. Levoxyl 75 mcg daily.  6. WelChol 625 mg 3 tablets at bedtime.   PAST MEDICAL HISTORY:  1. Significant for usual childhood diseases.  2. Adult history, hypertension.  3. DJD.  4. Diabetes.  5. Coronary artery disease.   PAST SURGICAL HISTORY:  1. He has had a surgery on his back and neck for fusions.  2. Knee scope in 2004.  3. Gallbladder in 2006.   SOCIAL HISTORY:  No tobacco, no ethanol, no IV drug abuse. He is  retired.  Lives with his wife.   FAMILY HISTORY:  Mother died at age of 39 with a history of  hypertension,  diabetes, and lung cancer.  Father died at age 73 with  history of MI and  DJD.   REVIEW OF SYSTEMS:  Positive for glasses, upper and lower dentures,  shortness of breath with exertion, and frequent urination.  Denies any  chest pain __________ illness.   PHYSICAL EXAMINATION:  The patient is 6 feet tall, weight 270 pounds.  Head is normocephalic, atraumatic.  Ears, TMs are clear.  Eye, pupils  are equal, round, and reactive to light and accommodation.  Nose is  patent.  Throat is midline.  The patient is well developed, well  nourished, in no acute distress, alert and oriented x3.  NECK:  Decreased in range of motion secondary to his previous fusions.  CHEST:  Clear to auscultation and percussion.  HEART:  Regular, rate, and rhythm.  ABDOMEN:  Soft, nontender.  Left knee range of motion about 100 degrees.  Positive crepitance, about 5 degrees of varus deformity.  Neurovascularly intact to the left lower extremity with decreased light  touch__________  right  lower extremity with footdrop.  SKIN:  Within normal limits.  EXTREMITIES:  Show bone-on-bone changes in medial compartment of both  knees.   PREOPERATIVE LABS:  CBC, CMET, chest x-ray, EKG, PT, and PTT are all  within normal limits with the exception of hemoglobin 11.7, hematocrit  32.8, glucose of 132, BUN of 29, EGR 59.   HOSPITAL COURSE:  On the day of admission, the patient was taken to the  operating room in the hospital where he underwent left total knee  arthroplasty using DePuy sigma RP components, all cemented, #5 femur, #5  tibia, 41  mm patella, 10 mm sigma RP spacer.  Medium Hemovac double-arm  was placed into the wound.  Foley catheter was placed perioperatively.  The patient was placed on postoperative Coumadin prophylaxis with a  target INR of 1.5-2 with __________  bridge of Lovenox until he became  therapeutic.  He was placed on the PCA Dilaudid pump for pain control.  Physical therapy was begun in the  recovery room using CPM, and the  patient was placed on perioperative antibiotics.  Postoperative day 1,  the patient was awake, alert, reporting no nausea or vomiting.  Pain  3/10, controlled well with PCA, taking POs well, temperature 98.6, urine  output 750 mL, and was discontinued the morning after surgery urine  output 1250, hemoglobin 9.5, INR 1.0.  Dressing was dry otherwise  stable.  Continue with physical therapy including CPM.  On postoperative  day 2, the patient was without complaint, T-max 101.2, ranging 98.8.  Vital signs were stable.  Hemoglobin 8.7, WBC 7.6, INR 1.4.  Dressing  was dry.  Calf  was soft and moderately tender.  He is neurovascularly  intact, otherwise stable.  PCA was discontinued.  Foley was  discontinued.  On postoperative day 3, the patient was without  complaint.  T-max 101.2, ranging 98.7, hemoglobin 9.5, WBC 8.7, and INR  1.7.  Dressing was dry.  Wound was benign.  He had not yet met his  physical therapy goals for safe ambulation and transfers, he had not yet  gone upstairs and so this was continued.  Incentive spirometer was  encouraged to try to decrease any atelectasis that might be causing  slight temperature elevations.  Postoperative day #4, the patient was  without complaint.  He was afebrile. Vitals signs were stable.  He was  alert and oriented.  INR 1.8.  Dressing was dry.  Wound was benign.  Chest x-ray was positive for atelectasis the day before.  __________  was benign.  He had met his physical therapy goals for safe ambulation,  and was discharged to home in the care of his family.  Diet consistent  with precautions for his diabetes and incentive spirometer was  encouraged.  Activities, weigh bearing as tolerated, walker, total knee  restrictions, dressing changes daily or as needed.  Return to clinic in  approximately 1 week's time or sooner if he has any increasing  temperature or increasing drainage, or pain not well controlled with   oral medications.   DISCHARGE MEDICATIONS:  1. Percocet 1-2 q.4 h. p.r.n. pain.  2. Coumadin as directed.  3. Robaxin 1-2 q.8 h. p.r.n. spasm.  4. Enalapril and hydrochlorothiazide 10/25 daily.  5. Gemfibrozil 600 mg b.i.d.  6. Glyburide and metformin 2.5/500 two tabs b.i.d.  7. Lantus 40 units subcu bedtime.  8. Levoxyl 75 mcg daily.  9. WelChol 625 mg 3 tablets at bedtime.   The patient will have home health  RN for a Coumadin draws as well as  home health PT, CPM, and __________ medical visits as needed for  advanced home care.      Laural Benes. Jannet Mantis.      Feliberto Gottron. Turner Daniels, M.D.  Electronically Signed    JBR/MEDQ  D:  10/31/2007  T:  11/01/2007  Job:  [id number]

## 2011-05-06 LAB — BASIC METABOLIC PANEL
CO2: 26
Calcium: 8.4
Creatinine, Ser: 1.38
GFR calc Af Amer: >60
Glucose, Bld: 193 — ABNORMAL HIGH

## 2011-05-06 LAB — DIFFERENTIAL
Basophils Absolute: 0
Basophils Relative: 1
Monocytes Absolute: 0.4
Neutro Abs: 3.9
Neutrophils Relative %: 64

## 2011-05-06 LAB — CBC
HCT: 27 — ABNORMAL LOW
HCT: 32.8 — ABNORMAL LOW
Hemoglobin: 9.5 — ABNORMAL LOW
MCHC: 35.3
MCHC: 35.5
MCV: 97
Platelets: 142 — ABNORMAL LOW
Platelets: 165
Platelets: 237
RBC: 2.79 — ABNORMAL LOW
RDW: 14.7
RDW: 14.7
RDW: 14.8
RDW: 15
WBC: 8.7

## 2011-05-06 LAB — PROTIME-INR
INR: 1
INR: 1.2
INR: 1.4
INR: 1.8 — ABNORMAL HIGH
Prothrombin Time: 13.5
Prothrombin Time: 15.3 — ABNORMAL HIGH
Prothrombin Time: 20.6 — ABNORMAL HIGH
Prothrombin Time: 21.5 — ABNORMAL HIGH

## 2011-05-06 LAB — URINE MICROSCOPIC-ADD ON

## 2011-05-06 LAB — URINALYSIS, ROUTINE W REFLEX MICROSCOPIC
Bilirubin Urine: NEGATIVE
Ketones, ur: NEGATIVE
Nitrite: NEGATIVE
Protein, ur: 30 — AB
pH: 5

## 2011-05-06 LAB — COMPREHENSIVE METABOLIC PANEL
Albumin: 3.8
BUN: 29 — ABNORMAL HIGH
Creatinine, Ser: 1.22
GFR calc Af Amer: >60
Potassium: 4.6
Total Protein: 6.4

## 2011-05-06 LAB — ABO/RH: ABO/RH(D): A POS

## 2011-05-06 LAB — TYPE AND SCREEN
ABO/RH(D): A POS
Antibody Screen: NEGATIVE

## 2011-05-06 LAB — APTT: aPTT: 29

## 2012-01-24 ENCOUNTER — Inpatient Hospital Stay (HOSPITAL_BASED_OUTPATIENT_CLINIC_OR_DEPARTMENT_OTHER)
Admission: EM | Admit: 2012-01-24 | Discharge: 2012-01-27 | DRG: 378 | Disposition: A | Discharge status: Home / Self Care | Payer: Medicare Other | Source: Ambulatory Visit | Attending: Internal Medicine | Admitting: Internal Medicine

## 2012-01-24 ENCOUNTER — Emergency Department (HOSPITAL_BASED_OUTPATIENT_CLINIC_OR_DEPARTMENT_OTHER): Payer: Medicare Other

## 2012-01-24 ENCOUNTER — Encounter (HOSPITAL_BASED_OUTPATIENT_CLINIC_OR_DEPARTMENT_OTHER): Payer: Self-pay | Admitting: *Deleted

## 2012-01-24 DIAGNOSIS — E669 Obesity, unspecified: Secondary | ICD-10-CM | POA: Diagnosis present

## 2012-01-24 DIAGNOSIS — A09 Infectious gastroenteritis and colitis, unspecified: Secondary | ICD-10-CM

## 2012-01-24 DIAGNOSIS — Z860100 Personal history of colon polyps, unspecified: Secondary | ICD-10-CM

## 2012-01-24 DIAGNOSIS — E875 Hyperkalemia: Secondary | ICD-10-CM

## 2012-01-24 DIAGNOSIS — K922 Gastrointestinal hemorrhage, unspecified: Secondary | ICD-10-CM

## 2012-01-24 DIAGNOSIS — E039 Hypothyroidism, unspecified: Secondary | ICD-10-CM

## 2012-01-24 DIAGNOSIS — D62 Acute posthemorrhagic anemia: Secondary | ICD-10-CM

## 2012-01-24 DIAGNOSIS — K219 Gastro-esophageal reflux disease without esophagitis: Secondary | ICD-10-CM

## 2012-01-24 DIAGNOSIS — R1310 Dysphagia, unspecified: Secondary | ICD-10-CM

## 2012-01-24 DIAGNOSIS — N189 Chronic kidney disease, unspecified: Secondary | ICD-10-CM

## 2012-01-24 DIAGNOSIS — R739 Hyperglycemia, unspecified: Secondary | ICD-10-CM | POA: Diagnosis present

## 2012-01-24 DIAGNOSIS — E785 Hyperlipidemia, unspecified: Secondary | ICD-10-CM

## 2012-01-24 DIAGNOSIS — M1990 Unspecified osteoarthritis, unspecified site: Secondary | ICD-10-CM

## 2012-01-24 DIAGNOSIS — D649 Anemia, unspecified: Secondary | ICD-10-CM

## 2012-01-24 DIAGNOSIS — G4730 Sleep apnea, unspecified: Secondary | ICD-10-CM

## 2012-01-24 DIAGNOSIS — R634 Abnormal weight loss: Secondary | ICD-10-CM

## 2012-01-24 DIAGNOSIS — K279 Peptic ulcer, site unspecified, unspecified as acute or chronic, without hemorrhage or perforation: Secondary | ICD-10-CM

## 2012-01-24 DIAGNOSIS — R197 Diarrhea, unspecified: Secondary | ICD-10-CM

## 2012-01-24 DIAGNOSIS — E119 Type 2 diabetes mellitus without complications: Secondary | ICD-10-CM

## 2012-01-24 DIAGNOSIS — R112 Nausea with vomiting, unspecified: Secondary | ICD-10-CM

## 2012-01-24 DIAGNOSIS — K269 Duodenal ulcer, unspecified as acute or chronic, without hemorrhage or perforation: Secondary | ICD-10-CM

## 2012-01-24 DIAGNOSIS — K56609 Unspecified intestinal obstruction, unspecified as to partial versus complete obstruction: Secondary | ICD-10-CM

## 2012-01-24 DIAGNOSIS — R000 Tachycardia, unspecified: Secondary | ICD-10-CM

## 2012-01-24 DIAGNOSIS — Z794 Long term (current) use of insulin: Secondary | ICD-10-CM

## 2012-01-24 DIAGNOSIS — K260 Acute duodenal ulcer with hemorrhage: Secondary | ICD-10-CM

## 2012-01-24 DIAGNOSIS — K264 Chronic or unspecified duodenal ulcer with hemorrhage: Secondary | ICD-10-CM | POA: Diagnosis present

## 2012-01-24 DIAGNOSIS — I10 Essential (primary) hypertension: Secondary | ICD-10-CM

## 2012-01-24 DIAGNOSIS — R1319 Other dysphagia: Secondary | ICD-10-CM

## 2012-01-24 DIAGNOSIS — D510 Vitamin B12 deficiency anemia due to intrinsic factor deficiency: Secondary | ICD-10-CM

## 2012-01-24 DIAGNOSIS — K274 Chronic or unspecified peptic ulcer, site unspecified, with hemorrhage: Principal | ICD-10-CM | POA: Diagnosis present

## 2012-01-24 DIAGNOSIS — N179 Acute kidney failure, unspecified: Secondary | ICD-10-CM

## 2012-01-24 HISTORY — DX: Chronic or unspecified duodenal ulcer with hemorrhage: K26.4

## 2012-01-24 HISTORY — DX: Obesity, unspecified: E66.9

## 2012-01-24 HISTORY — DX: Hypothyroidism, unspecified: E03.9

## 2012-01-24 HISTORY — DX: Irritable bowel syndrome, unspecified: K58.9

## 2012-01-24 HISTORY — DX: Chronic kidney disease, unspecified: N18.9

## 2012-01-24 HISTORY — DX: Essential (primary) hypertension: I10

## 2012-01-24 HISTORY — DX: Pure hyperglyceridemia: E78.1

## 2012-01-24 HISTORY — DX: Pure hypercholesterolemia, unspecified: E78.00

## 2012-01-24 HISTORY — DX: Deficiency of other specified B group vitamins: E53.8

## 2012-01-24 HISTORY — DX: Obstructive sleep apnea (adult) (pediatric): G47.33

## 2012-01-24 HISTORY — DX: Personal history of colonic polyps: Z86.010

## 2012-01-24 HISTORY — DX: Iron deficiency anemia secondary to blood loss (chronic): D50.0

## 2012-01-24 LAB — CBC
HCT: 23.1 % — ABNORMAL LOW (ref 39.0–52.0)
MCHC: 33.8 g/dL (ref 30.0–36.0)
RDW: 14.2 % (ref 11.5–15.5)

## 2012-01-24 LAB — COMPREHENSIVE METABOLIC PANEL
Alkaline Phosphatase: 104 U/L (ref 39–117)
BUN: 80 mg/dL — ABNORMAL HIGH (ref 6–23)
CO2: 22 mEq/L (ref 19–32)
Chloride: 108 mEq/L (ref 96–112)
GFR calc Af Amer: 24 mL/min — ABNORMAL LOW (ref >90)
Glucose, Bld: 329 mg/dL — ABNORMAL HIGH (ref 70–99)
Potassium: 6.2 mEq/L — ABNORMAL HIGH (ref 3.5–5.1)
Total Bilirubin: 0.2 mg/dL — ABNORMAL LOW (ref 0.3–1.2)

## 2012-01-24 LAB — DIFFERENTIAL
Basophils Absolute: 0 10*3/uL (ref 0.0–0.1)
Eosinophils Absolute: 0.1 10*3/uL (ref 0.0–0.7)
Lymphocytes Relative: 16 % (ref 12–46)
Monocytes Absolute: 0.4 10*3/uL (ref 0.1–1.0)
Neutro Abs: 5.1 10*3/uL (ref 1.7–7.7)

## 2012-01-24 LAB — LACTIC ACID, PLASMA: Lactic Acid, Venous: 1.6 mmol/L (ref 0.5–2.2)

## 2012-01-24 LAB — GLUCOSE, CAPILLARY: Glucose-Capillary: 195 mg/dL — ABNORMAL HIGH (ref 70–99)

## 2012-01-24 MED ORDER — INSULIN REGULAR HUMAN 100 UNIT/ML IJ SOLN
10.0000 [IU] | Freq: Once | INTRAMUSCULAR | Status: AC
  Administered 2012-01-24: 10 [IU] via SUBCUTANEOUS

## 2012-01-24 MED ORDER — SODIUM CHLORIDE 0.9 % IV SOLN
80.0000 mg | Freq: Once | INTRAVENOUS | Status: AC
  Administered 2012-01-24: 80 mg via INTRAVENOUS
  Filled 2012-01-24: qty 80

## 2012-01-24 MED ORDER — SODIUM CHLORIDE 0.9 % IV BOLUS (SEPSIS)
500.0000 mL | Freq: Once | INTRAVENOUS | Status: AC
  Administered 2012-01-24: 500 mL via INTRAVENOUS

## 2012-01-24 MED ORDER — SODIUM CHLORIDE 0.9 % IV BOLUS (SEPSIS)
1000.0000 mL | Freq: Once | INTRAVENOUS | Status: AC
  Administered 2012-01-24: 1000 mL via INTRAVENOUS

## 2012-01-24 MED ORDER — SODIUM CHLORIDE 0.9 % IV SOLN
8.0000 mg/h | INTRAVENOUS | Status: DC
  Administered 2012-01-24 – 2012-01-26 (×4): 8 mg/h via INTRAVENOUS
  Filled 2012-01-24 (×5): qty 80
  Filled 2012-01-24: qty 250
  Filled 2012-01-24: qty 80

## 2012-01-24 MED ORDER — INSULIN ASPART PROT & ASPART (70-30 MIX) 100 UNIT/ML ~~LOC~~ SUSP
SUBCUTANEOUS | Status: AC
  Administered 2012-01-24: 10 [IU]
  Filled 2012-01-24: qty 3

## 2012-01-24 MED ORDER — DIPHENHYDRAMINE HCL 50 MG/ML IJ SOLN: 25.0000 mg | Freq: Four times a day (QID) | INTRAMUSCULAR | Status: DC | PRN

## 2012-01-24 MED ORDER — SODIUM CHLORIDE 0.9 % IV SOLN
INTRAVENOUS | Status: DC
  Administered 2012-01-24 – 2012-01-25 (×2): via INTRAVENOUS

## 2012-01-24 MED ORDER — INSULIN ASPART 100 UNIT/ML ~~LOC~~ SOLN
0.0000 [IU] | SUBCUTANEOUS | Status: DC
  Administered 2012-01-25 – 2012-01-26 (×4): 2 [IU] via SUBCUTANEOUS
  Administered 2012-01-26: 3 [IU] via SUBCUTANEOUS
  Administered 2012-01-26: 2 [IU] via SUBCUTANEOUS
  Administered 2012-01-26 (×2): 3 [IU] via SUBCUTANEOUS
  Administered 2012-01-27: 5 [IU] via SUBCUTANEOUS
  Administered 2012-01-27: 3 [IU] via SUBCUTANEOUS
  Administered 2012-01-27: 5 [IU] via SUBCUTANEOUS
  Administered 2012-01-27: 3 [IU] via SUBCUTANEOUS

## 2012-01-24 MED ORDER — BIOTENE DRY MOUTH MT LIQD
15.0000 mL | Freq: Two times a day (BID) | OROMUCOSAL | Status: DC
  Administered 2012-01-25 – 2012-01-27 (×4): 15 mL via OROMUCOSAL

## 2012-01-24 MED ORDER — ACETAMINOPHEN 325 MG PO TABS: 650.0000 mg | ORAL_TABLET | ORAL | Status: DC | PRN

## 2012-01-24 NOTE — ED Notes (Signed)
Rectal bleeding and vomiting blood earlier today. Hx of GI bleed. Pale diaphoretic.

## 2012-01-24 NOTE — H&P (Signed)
Name: Chad Carter MRN: [id number] DOB: [birth year]    LOS: 0  Referring Provider:  [facility]P [facility] Reason for Referral:  Upper GI hemorrhage  PULMONARY / CRITICAL CARE MEDICINE  HPI:  78 yo with history of peptic ulcer disease and previous upper GI hemorrhage who presented to [facility] ED on 6/11 with several episodes of hematemesis and multiple tarry stools.  This was associated with epigastric abdominal pain aggravated by eating, which started several weeks ago.  There was no chest pain or dyspnea.  He denies anticoagulant, antiplatelet, NSAID or alcohol use.  Laboratory investigation also revealed acute on chronic renal failure and hyperkalemia.  Past Medical History  Diagnosis Date  . GI bleed   . Diabetes mellitus   . Hypertension   . High cholesterol   . High triglycerides   . Renal disorder    Past Surgical History  Procedure Date  . Cholecystectomy   . Arm surg   . Knee surgery   . Ulcer surg    Prior to Admission medications   Medication Sig Start Date End Date Taking? Authorizing Provider  augmented betamethasone dipropionate (DIPROLENE-AF) 0.05 % cream Apply topically 2 (two) times daily.   Yes Historical Provider, MD  diphenoxylate-atropine (LOMOTIL) 2.5-0.025 MG per tablet Take 1 tablet by mouth 4 (four) times daily as needed.   Yes Historical Provider, MD  enalapril-hydrochlorothiazide (VASERETIC) 10-25 MG per tablet Take 1 tablet by mouth daily.   Yes Historical Provider, MD  gemfibrozil (LOPID) 600 MG tablet Take 600 mg by mouth 2 (two) times daily before a meal.   Yes Historical Provider, MD  glyBURIDE-metformin (GLUCOVANCE) 2.5-500 MG per tablet Take 1 tablet by mouth daily with breakfast.   Yes Historical Provider, MD  insulin glargine (LANTUS OPTICLIK) 100 UNIT/ML injection Inject 40 Units into the skin at bedtime.   Yes Historical Provider, MD  Insulin Syringe-Needle U-100 (RELION INSULIN SYR 1CC/30G) 30G X 5/16" 1 ML MISC by Does not apply route.   Yes Historical  Provider, MD  levothyroxine (SYNTHROID, LEVOTHROID) 75 MCG tablet Take 75 mcg by mouth daily.   Yes Historical Provider, MD  ranitidine (ZANTAC) 150 MG tablet Take 150 mg by mouth 2 (two) times daily.   Yes Historical Provider, MD  Testosterone (ANDROGEL PUMP) 20.25 MG/ACT (1.62%) GEL Place onto the skin.   Yes Historical Provider, MD  testosterone cypionate (DEPOTESTOTERONE CYPIONATE) 200 MG/ML injection Inject 100 mg into the muscle every 14 (fourteen) days.   Yes Historical Provider, MD   Allergies Allergies  Allergen Reactions  . Niacin And Related    Family History History reviewed. No pertinent family history. Social History  reports that he has never smoked. He does not have any smokeless tobacco history on file. He reports that he does not drink alcohol or use illicit drugs.  Review Of Systems:  Reports intermittent dysphagia with apparently negative workup and frequent food reflux.  Otherwise as per HPI.  Brief patient description:  78 y/o with peptic ulcer disease admitted on 6/11 with hematemesis, tarry stools and epigastric abdominal pain.  Events Since Admission: 6/11  Admitted on 6/11 with hematemesis, tarry stools and epigastric abdominal pain.  Current Status:  Vital Signs: Temp:  [98.3 F (36.8 C)-99.3 F (37.4 C)] 99.3 F (37.4 C) (06/11 2300) Pulse Rate:  [100-122] 122  (06/11 2300) Resp:  [20-30] 20  (06/11 2300) BP: (128-159)/(60-108) 149/108 mmHg (06/11 2300) SpO2:  [94 %-100 %] 100 % (06/11 2300) Weight:  [122.1 kg (269 lb 2.9 oz)-123.378  kg (272 lb)] 122.1 kg (269 lb 2.9 oz) (06/11 2300)  Physical Examination: General:  No acute distress, anxious  Neuro:  Awake, alert, oriented HEENT:  Pink conjunctiva, dry membranes Neck:  No JVD Cardiovascular:  Regular, rapid, no murmurs Lungs:  CTAB Abdomen:  Obese, bowel sounds present, tenderness without rebound in epigastric area Musculoskeletal:  No edema Skin:  Intact  Active Problems:  Acute renal  failure  Hyperkalemia  Acute upper GI hemorrhage  Acute blood loss anemia  Tachycardia  Peptic ulcer disease  Hyperglycemia  Diabetes mellitus  ASSESSMENT AND PLAN  PULMONARY No results found for this basename: PHART:5,PCO2:5,PCO2ART:5,PO2ART:5,HCO3:5,O2SAT:5 in the last 168 hours Ventilator Settings:   CXR:  NAD ETT:  NA  A:  No active issues P:   Supplemental oxygen PRN  CARDIOVASCULAR  Lab 01/24/12 2023  TROPONINI <0.30  LATICACIDVEN 1.6  PROBNP --   ECG:  6/11 >>> Sinus tachycardia, no ST-T changes Lines: NA  A: Sinus tachycardia.  No evidence of ischemia.  Hemodynamically stable. P:  Telemetry  Two large bore PIVs Hold antihypertensives  RENAL  Lab 01/24/12 2118 01/24/12 2023  NA -- 140  K 6.4* 6.2*  CL -- 108  CO2 -- 22  BUN -- 80*  CREATININE -- 2.80*  CALCIUM -- 8.2*  MG -- --  PHOS -- --   Intake/Output    None    Foley:  NA  A:  Acute on chronic renal failure.  Hyperkalemia, treated with glucose / insulin. P:   IVF NS 500 mL bolus f/b 100 mL/h maintenance BMP now  GASTROINTESTINAL  Lab 01/24/12 2023  AST 16  ALT 13  ALKPHOS 104  BILITOT 0.2*  PROT 6.5  ALBUMIN 3.4*   A:  Acute upper GI hemorrhage.  History of PUD (duodenal). P:   Protonix gtt GI consulted by EDP, EGD in AM NPO NGT if nausea / emesis  HEMATOLOGIC  Lab 01/24/12 2023  HGB 7.8*  HCT 23.1*  PLT 212  INR 1.18  APTT --   A:  Acute blood loss anemia. P:  H&H q6h Transfuse 2 units PRBC Goal Hb=10 as active hemorrhage  INFECTIOUS  Lab 01/24/12 2023  WBC 6.7  PROCALCITON --   Cultures: NA Antibiotics: Na  A:  No evidence of infection P:   No interventions required  ENDOCRINE  Lab 01/24/12 2305  GLUCAP 195*   A:  Hyperglycemia.  History of DM.  Chronic testosterone supplementation.  Hypothyroidism. P:   Hold PO medications / Lantus for now, restart when able to take PO SSI/CBG  NEUROLOGIC  A:  No active issues P:   No interventions  reqquired  BEST PRACTICE / DISPOSITION Level of Care:  ICU Primary Service:  PCCM Consultants:  GI Code Status:  Full Diet:  NPO DVT Px:  SCDs GI Px:  Not indicated (on Protonix gtt) Skin Integrity:  No issues Social / Family:  Updated  The patient is critically ill with multiple organ systems failure and requires high complexity decision making for assessment and support, frequent evaluation and titration of therapies, application of advanced monitoring technologies and extensive interpretation of multiple databases. Critical Care Time devoted to patient care services described in this note is 35 minutes.  Lonia Farber, M.D. Pulmonary and Critical Care Medicine [facility] Pager: [phone]  01/24/2012, 11:31 PM

## 2012-01-24 NOTE — ED Provider Notes (Signed)
History     CSN: [id number]  Arrival date & time 01/24/12  1939   First MD Initiated Contact with Patient 01/24/12 1951      Chief Complaint  Patient presents with  . Abdominal Pain    (Consider location/radiation/quality/duration/timing/severity/associated sxs/prior treatment) HPI Patient is a 78 yo male with a history of GI bleeding who presents today complaining of 3 episodes of hemoptysis as well as multiple dark tarry stools.  Patient complains of associated epigastric abdominal pain and shortness of breath.  Patient denies chest pain but endorses lightheadedness.  Patient was hospitalized with GI bleeding requiring cauterization and transfusion by Dr. Marina Goodell 2 years ago.  Patient has been coughing frequently and been ill with cold like symptoms for about a week.  Apparently his abdominal pain has been going on for a few weeks.  He complains of generalized weakness as well.  Abnormalities in emesis and stools began today per patient.  He is on no blood thinners.  There are no other associated or modifying factors.  Pain was rated as sharp and 8/10 earlier though patient reports only minor ache at this time in the epigastric area.  Past Medical History  Diagnosis Date  . GI bleed   . Diabetes mellitus   . Hypertension   . High cholesterol   . High triglycerides   . Renal disorder     Past Surgical History  Procedure Date  . Cholecystectomy   . Arm surg   . Knee surgery   . Ulcer surg     History reviewed. No pertinent family history.  History  Substance Use Topics  . Smoking status: Never Smoker   . Smokeless tobacco: Not on file  . Alcohol Use: No      Review of Systems  Constitutional: Positive for fatigue.  HENT: Negative.   Eyes: Negative.   Respiratory: Positive for cough and shortness of breath.   Cardiovascular: Negative.   Gastrointestinal: Positive for nausea, vomiting, abdominal pain, diarrhea and blood in stool.  Genitourinary: Negative.     Musculoskeletal: Negative.   Skin: Positive for pallor.  Neurological: Positive for light-headedness.  Psychiatric/Behavioral: Negative.   All other systems reviewed and are negative.    Allergies  Review of patient's allergies indicates not on file.  Home Medications  No current outpatient prescriptions on file.  BP 159/103  Pulse 100  Temp(Src) 98.3 F (36.8 C) (Oral)  Resp 30  SpO2 94%  Physical Exam  Nursing note and vitals reviewed. GEN: Well-developed, well-nourished male in moderate distress, diaphoretic HEENT: Atraumatic, normocephalic.  EYES: PERRLA BL, no scleral icterus.Conjunctivae pale NECK: Trachea midline, no meningismus CV: tachy with regular rhythm. No murmurs, rubs, or gallops PULM: Tacypneic.  No crackles, wheezes, or rales. GI: soft, mild epigastric TTP, no distension. No guarding, rebound. + bowel sounds. Rectal with dark maroon black stool with slight maroon tinge, guaiac +, external hemorrhoid noted GU: deferred Neuro: cranial nerves 2-12 grossly intact, no abnormalities of strength or sensation, A and O x 3 MSK: Patient moves all 4 extremities symmetrically, no deformity, edema, or injury noted Skin: No rashes petechiae, purpura, or jaundice Psych: no abnormality of mood   ED Course  Procedures (including critical care time)  CRITICAL CARE Performed by: Cyndra Numbers   Total critical care time: 30 minutes  Critical care time was exclusive of separately billable procedures and treating other patients.  Critical care was necessary to treat or prevent imminent or life-threatening deterioration.  Critical care was time spent  personally by me on the following activities: development of treatment plan with patient and/or surrogate as well as nursing, discussions with consultants, evaluation of patient's response to treatment, examination of patient, obtaining history from patient or surrogate, ordering and performing treatments and interventions,  ordering and review of laboratory studies, ordering and review of radiographic studies, pulse oximetry and re-evaluation of patient's condition.  Indication: weakness  Date: 01/24/2012  Rate: 100  Rhythm: normal sinus rhythm  QRS Axis: normal  Intervals: normal  ST/T Wave abnormalities: nonspecific T wave changes  Conduction Disutrbances:none  Narrative Interpretation: new T wave inversion in aVL compared to ECG from 10/03/2007  Old EKG Reviewed: changes noted   Labs Reviewed  CBC - Abnormal; Notable for the following:    RBC 2.27 (*)    Hemoglobin 7.8 (*)    HCT 23.1 (*)    MCV 101.8 (*)    MCH 34.4 (*)    All other components within normal limits  COMPREHENSIVE METABOLIC PANEL - Abnormal; Notable for the following:    Potassium 6.2 (*)    Glucose, Bld 329 (*)    BUN 80 (*)    Creatinine, Ser 2.80 (*)    Calcium 8.2 (*)    Albumin 3.4 (*)    Total Bilirubin 0.2 (*)    GFR calc non Af Amer 20 (*)    GFR calc Af Amer 24 (*)    All other components within normal limits  LIPASE, BLOOD - Abnormal; Notable for the following:    Lipase 77 (*)    All other components within normal limits  PROTIME-INR - Abnormal; Notable for the following:    Prothrombin Time 15.3 (*)    All other components within normal limits  DIFFERENTIAL  LACTIC ACID, PLASMA  TROPONIN I  OCCULT BLOOD X 1 CARD TO LAB, STOOL  OCCULT BLOOD X 1 CARD TO LAB, STOOL   Dg Chest Portable 1 View  01/24/2012  *RADIOLOGY REPORT*  Clinical Data: Cough and rectal bleeding.  PORTABLE CHEST - 1 VIEW  Comparison: None  Findings: Heart size and mediastinal contours appear normal.  There is no pleural effusion or pulmonary edema.  The lung volumes are low.  No airspace consolidation identified.  IMPRESSION:  1.  Low lung volumes. 2.  No pneumonia.  Original Report Authenticated By: Rosealee Albee, M.D.     1. GI bleeding   2. Anemia   3. Acute on chronic renal insufficiency       MDM  Patient presented in  distress.  Patient denied chest pain but was pale and reported GI bleeding.  Hemoccult was positive.  ECG showed one new T wave inversion but no concerning acute ischedmic changes.  CBC indicated Hbg of 7.8 with last value 10.8 taken 1 year ago.  Patient also had acute renal insufficiency with BUN 80 and creatinine of 2.8 with prior value of 1.7.  Patient was also noted to be hyperglycemic and to have glucose of 329.  K was 6.2 though no ECG changes of hyperkalemia were noted on ECG.  Patient was placed on protonix gtt after bolus and received 1 L bolus of NS.  Given shortness of breath PNA was ruled out with CXR.  Gastroenterology and critical care were paged.  Patient was given 10 units of insulin IV while K was repeated.  Patient was discussed with Dr. Arlyce Dice with [facility] and he will see the patient in the AM unless condition changes.  Patient also discussed with Dr.  Alva from critical care who will admit the patient.    Patient will be admitted to [facility] ICU.        Cyndra Numbers, MD 01/24/12 2129

## 2012-01-25 ENCOUNTER — Encounter (HOSPITAL_COMMUNITY): Payer: Self-pay | Admitting: Physician Assistant

## 2012-01-25 ENCOUNTER — Encounter (HOSPITAL_COMMUNITY)
Admission: EM | Disposition: A | Discharge status: Home / Self Care | Payer: Self-pay | Source: Ambulatory Visit | Attending: Pulmonary Disease

## 2012-01-25 DIAGNOSIS — E119 Type 2 diabetes mellitus without complications: Secondary | ICD-10-CM | POA: Diagnosis present

## 2012-01-25 DIAGNOSIS — R000 Tachycardia, unspecified: Secondary | ICD-10-CM | POA: Diagnosis present

## 2012-01-25 DIAGNOSIS — R739 Hyperglycemia, unspecified: Secondary | ICD-10-CM | POA: Diagnosis present

## 2012-01-25 DIAGNOSIS — K264 Chronic or unspecified duodenal ulcer with hemorrhage: Secondary | ICD-10-CM | POA: Diagnosis present

## 2012-01-25 DIAGNOSIS — N179 Acute kidney failure, unspecified: Secondary | ICD-10-CM | POA: Diagnosis present

## 2012-01-25 DIAGNOSIS — D62 Acute posthemorrhagic anemia: Secondary | ICD-10-CM | POA: Diagnosis present

## 2012-01-25 DIAGNOSIS — E875 Hyperkalemia: Secondary | ICD-10-CM | POA: Diagnosis present

## 2012-01-25 DIAGNOSIS — K922 Gastrointestinal hemorrhage, unspecified: Secondary | ICD-10-CM | POA: Diagnosis present

## 2012-01-25 HISTORY — PX: ESOPHAGOGASTRODUODENOSCOPY: SHX5428

## 2012-01-25 LAB — BASIC METABOLIC PANEL
CO2: 20 mEq/L (ref 19–32)
Calcium: 7.9 mg/dL — ABNORMAL LOW (ref 8.4–10.5)
Calcium: 8.2 mg/dL — ABNORMAL LOW (ref 8.4–10.5)
Creatinine, Ser: 2.71 mg/dL — ABNORMAL HIGH (ref 0.50–1.35)
GFR calc Af Amer: 25 mL/min — ABNORMAL LOW (ref >90)
GFR calc non Af Amer: 21 mL/min — ABNORMAL LOW (ref >90)
Potassium: 5 mEq/L (ref 3.5–5.1)
Sodium: 144 mEq/L (ref 135–145)

## 2012-01-25 LAB — CBC
HCT: 24.1 % — ABNORMAL LOW (ref 39.0–52.0)
MCHC: 34 g/dL (ref 30.0–36.0)
Platelets: 171 10*3/uL (ref 150–400)
RDW: 16 % — ABNORMAL HIGH (ref 11.5–15.5)

## 2012-01-25 LAB — MRSA PCR SCREENING: MRSA by PCR: NEGATIVE

## 2012-01-25 LAB — HEMOGLOBIN AND HEMATOCRIT, BLOOD
HCT: 24.9 % — ABNORMAL LOW (ref 39.0–52.0)
Hemoglobin: 8.5 g/dL — ABNORMAL LOW (ref 13.0–17.0)

## 2012-01-25 LAB — GLUCOSE, CAPILLARY
Glucose-Capillary: 85 mg/dL (ref 70–99)
Glucose-Capillary: 137 mg/dL — ABNORMAL HIGH (ref 70–99)

## 2012-01-25 SURGERY — EGD (ESOPHAGOGASTRODUODENOSCOPY)
Anesthesia: Moderate Sedation

## 2012-01-25 MED ORDER — DIPHENHYDRAMINE HCL 50 MG/ML IJ SOLN
INTRAMUSCULAR | Status: AC
  Filled 2012-01-25: qty 1

## 2012-01-25 MED ORDER — MIDAZOLAM HCL 10 MG/2ML IJ SOLN
INTRAMUSCULAR | Status: AC
  Filled 2012-01-25: qty 2

## 2012-01-25 MED ORDER — FENTANYL CITRATE 0.05 MG/ML IJ SOLN
INTRAMUSCULAR | Status: AC
  Filled 2012-01-25: qty 2

## 2012-01-25 MED ORDER — MIDAZOLAM HCL 10 MG/2ML IJ SOLN
INTRAMUSCULAR | Status: DC | PRN
  Administered 2012-01-25: 2 mg via INTRAVENOUS
  Administered 2012-01-25: 1 mg via INTRAVENOUS

## 2012-01-25 MED ORDER — BUTAMBEN-TETRACAINE-BENZOCAINE 2-2-14 % EX AERO
INHALATION_SPRAY | CUTANEOUS | Status: DC | PRN
  Administered 2012-01-25: 2 via TOPICAL

## 2012-01-25 MED ORDER — FENTANYL NICU IV SYRINGE 50 MCG/ML
INJECTION | INTRAMUSCULAR | Status: DC | PRN
  Administered 2012-01-25 (×2): 25 ug via INTRAVENOUS

## 2012-01-25 MED ORDER — SODIUM CHLORIDE 0.9 % IV SOLN: Freq: Once | INTRAVENOUS | Status: DC

## 2012-01-25 MED ORDER — SODIUM CHLORIDE 0.45 % IV SOLN
INTRAVENOUS | Status: DC
  Administered 2012-01-25: 1000 mL via INTRAVENOUS
  Administered 2012-01-26: 11:00:00 via INTRAVENOUS

## 2012-01-25 NOTE — Progress Notes (Signed)
Nutrition Brief Note  RD pulled to patient regarding Nutrition Risk Report: problems chewing or swallowing foods and/or liquids per admission nutrition screen. Reports occasionally foods or liquids "get stuck" in his throat; he states he's had this problem for the past 10 years. Per GI note, patient had an EGD and esophagram in the past; no evidenced of stricture. Reports a good appetite. BMI = 36.6 kg/m2 (Obesity Class II). No nutrition intervention warranted at this time. Please consult RD as needed.  Alger Memos, RD, LDN Pager #: [phone]

## 2012-01-25 NOTE — Op Note (Signed)
[facility] [address] [place], [place]  [address]  ENDOSCOPY PROCEDURE REPORT  PATIENT:  Chad Carter, Chad Carter  MR#:  [id number] BIRTHDATE:  March 01, 1934, 78 yrs. old  GENDER:  male ENDOSCOPIST:  Rachael Fee, MD PROCEDURE DATE:  01/25/2012 PROCEDURE:  EGD for control of bleeding ASA CLASS:  Class III INDICATIONS:  hematemesis, melena; h/o duodenal ulcer treated by Dr. Marina Goodell 2-3 years ago, once weekly NSAIDs, on ranitidine MEDICATIONS:  Fentanyl 50 mcg IV, Versed 3 mg IV TOPICAL ANESTHETIC:  Cetacaine Spray  DESCRIPTION OF PROCEDURE:   After the risks benefits and alternatives of the procedure were thoroughly explained, informed consent was obtained.  The Pentax Gastroscope [id number] endoscope was introduced through the mouth and advanced to the second portion of the duodenum, without limitations.  The instrument was slowly withdrawn as the mucosa was fully examined. <<PROCEDUREIMAGES>> There was a 1cm long linear ulcer in distal duodenal bulb with edematous adjacent mucosa. There was recent red clot at the site and it was slightly oozing. 3 endoclips were used to close the ulcer crater, during closure the ulcer bled more briskly but this stopped by the end of endoclip placement (see image004, image005, image007, and image008).  Otherwise the examination was normal (see image006, image002, and image001).    Retroflexed views revealed no abnormalities.    The scope was then withdrawn from the patient and the procedure completed. COMPLICATIONS:  None  ENDOSCOPIC IMPRESSION: 1) Duodenal ulcer, oozing.  This was treated with placement of endoclips 2) Otherwise normal examination  RECOMMENDATIONS: Continue IV PPI another 24 hours then can change to oral twice daily.  Clears today.  Watch blood counts.  He was Clo test negative in 2011 (not checked again today).  ______________________________ Rachael Fee, MD  n. eSIGNED:   Rachael Fee at  01/25/2012 01:45 PM  Belva Bertin, [phone]

## 2012-01-25 NOTE — Progress Notes (Signed)
UR Completed.  Chad Carter [phone] 01/25/2012

## 2012-01-25 NOTE — Consult Note (Signed)
[facility] Consult: 9:02 AM 01/25/2012   Referring Provider: Blima Dessert, CCM Primary Care Physician:  Tarri Fuller Primary Gastroenterologist:  Dr. Yancey Flemings  Reason for Consultation:  Hemetemesis and melena  HPI: Chad Carter is a 78 y.o. male with hypertension, hyperlipidemia, obesity, sleep apnea, long-standing diabetes mellitus, chronic renal insufficiency, hypothyroidism, peptic ulcer disease complicated by bleeding, and B12 deficiency.  Acute GI bleed/anemia in 11/2009, duodenal ulcers on EGD.  Adenomatous colon polyps 10/2009 by colonoscopy in [facility]. Seen by Dr Marina Goodell 06/2010 - 10/2010 for c/o diarrhea and had not complied with any of the recommendations.  Omeprazole was d/c'd, initiated Ranitidine and Lomotil.  Both of which he is compliant with using. Still has occasional spells with diarrhea but not melenic or bloody stools..   Admitted 01/24/2012 with tarry stools and hemetemesis.  Having epigastric pain since 6//8.  EGD at primary care office unremarkable.  Around 2 PM through 7 PM yesterday, 2 to 3 episodes of hemetmesis and melenic stools.  No episodes since then. Labs show renal failure, hyperkalemia, Hgb 7.8.  Coags normal. Hgb 8.5 after 2 units PRBCs  One month of anorexia, chronic dysphagia at area of EUS, never had stictures seen on EGD. No weight loss.  No unusual bleeding or bruising.  Uses Aleve, one tablet 2 x weekly when he plays golf.  Chronic exertional dyspnea.  No cough, no chest pain.  No dizzyness.  No ASA.  No extremity edema.  Chronic knee pain.  Never got fitted for CPAP though carries dx of OSA.  No hematuria or dysuria. No anxiety or depression     Past Medical History  Diagnosis Date  . GI bleed 10/2009  . Diabetes mellitus   . Hypertension   . High cholesterol   . High triglycerides   . Obesity   . Sleep apnea, obstructive   . Duodenal ulcer with hemorrhage 10/2009  . Irritable bowel  syndrome (IBS)     diarrhea predominent  . Anemia due to blood loss 10/2009  . Vitamin B 12 deficiency   . Hypothyroidism   . Chronic kidney disease   . Hx of adenomatous colonic polyps 10/2009    colonoscopy in [facility]    Past Surgical History  Procedure Date  . Cholecystectomy   . Arm surg   . Knee surgery   . Ulcer surg     Prior to Admission medications   Medication Sig Start Date End Date Taking? Authorizing Provider  augmented betamethasone dipropionate (DIPROLENE-AF) 0.05 % cream Apply topically 2 (two) times daily.   Yes Historical Provider, MD  diphenoxylate-atropine (LOMOTIL) 2.5-0.025 MG per tablet Take 1 tablet by mouth 4 (four) times daily as needed.   Yes Historical Provider, MD  enalapril-hydrochlorothiazide (VASERETIC) 10-25 MG per tablet Take 1 tablet by mouth daily.   Yes Historical Provider, MD  gemfibrozil (LOPID) 600 MG tablet Take 600 mg by mouth 2 (two) times daily before a meal.   Yes Historical Provider, MD  glyBURIDE-metformin (GLUCOVANCE) 2.5-500 MG per tablet Take 1 tablet by mouth daily with breakfast.   Yes Historical Provider, MD  insulin glargine (LANTUS OPTICLIK) 100 UNIT/ML injection Inject 40 Units into the skin at bedtime.   Yes Historical Provider, MD  Insulin Syringe-Needle U-100 (RELION INSULIN SYR 1CC/30G) 30G X 5/16" 1 ML MISC by Does not apply route.   Yes Historical Provider, MD  levothyroxine (SYNTHROID, LEVOTHROID) 75 MCG tablet Take 75 mcg by mouth daily.   Yes Historical Provider, MD  ranitidine (  ZANTAC) 150 MG tablet Take 150 mg by mouth 2 (two) times daily.   Yes Historical Provider, MD  Testosterone (ANDROGEL PUMP) 20.25 MG/ACT (1.62%) GEL Place onto the skin.   Yes Historical Provider, MD  testosterone cypionate (DEPOTESTOTERONE CYPIONATE) 200 MG/ML injection Inject 100 mg into the muscle every 14 (fourteen) days.   Yes Historical Provider, MD    Scheduled Meds:    . antiseptic oral rinse  15 mL Mouth Rinse BID  . insulin aspart   0-15 Units Subcutaneous Q4H  . insulin aspart protamine-insulin aspart      . insulin regular  10 Units Subcutaneous Once  . pantoprazole (PROTONIX) IV  80 mg Intravenous Once  . sodium chloride  1,000 mL Intravenous Once  . sodium chloride  500 mL Intravenous Once   Infusions:    . sodium chloride 100 mL/hr at 01/25/12 0431  . pantoprozole (PROTONIX) infusion 8 mg/hr (01/25/12 0800)   PRN Meds: acetaminophen, diphenhydrAMINE   Allergies as of 01/24/2012 - Review Complete 01/24/2012  Allergen Reaction Noted  . Niacin and related  01/24/2012    History reviewed. No pertinent family history.  History   Social History  . Marital Status: Married    Spouse Name: N/A    Number of Children: N/A  . Years of Education: N/A   Occupational History  . Not on file.   Social History Main Topics  . Smoking status: Never Smoker   . Smokeless tobacco: Not on file  . Alcohol Use: No  . Drug Use: No  . Sexually Active:    Other Topics Concern  . Not on file   Social History Narrative  . No narrative on file    REVIEW OF SYSTEMS: See HPI for results of 14 system review.   PHYSICAL EXAM: Vital signs in last 24 hours: Temp:  [97.3 F (36.3 C)-99.3 F (37.4 C)] 97.3 F (36.3 C) (06/12 0817) Pulse Rate:  [100-122] 104  (06/12 0800) Resp:  [14-30] 19  (06/12 0800) BP: (126-174)/(60-108) 148/64 mmHg (06/12 0800) SpO2:  [94 %-100 %] 99 % (06/12 0800) Weight:  [269 lb 2.9 oz (122.1 kg)-272 lb (123.378 kg)] 270 lb 8.1 oz (122.7 kg) (06/12 0500)  General: obese, looks chronically unwell Head:  No signs of trauma  Eyes:  No icterus or pallor Ears:  Slightly HOH  Nose:  No discharge Mouth:  Dentures in place.  No blood in mouth Neck:  No mass, no JVD Lungs:  Clear B/ Heart: RRR Abdomen:  Soft, obese, NT, active BS.  No mass.  Fullness without palpable mass or liver edge in RUQ.   Rectal: deferred   GU:  No scrotal edema Musc/Skeltl: post surgical left knee  changes Extremities:  No pedal edema  Neurologic:  Pleasant, not confused Skin:  No rash or sores Tattoos:  None seen Nodes:  No palpable nodes at neck or groin   Psych:  Pleasant, relaxed.   Intake/Output from previous day: 06/11 0701 - 06/12 0700 In: 1352.5 [I.V.:985.8; Blood:366.7] Out: 1250 [Urine:1250] Intake/Output this shift: Total I/O In: 125 [I.V.:125] Out: -   LAB RESULTS:  Basename 01/25/12 0614 01/24/12 2023  WBC -- 6.7  HGB 8.5* 7.8*  HCT 24.9* 23.1*  PLT -- 212   BMET Lab Results  Component Value Date   NA 145 01/25/2012   NA 140 01/24/2012   NA 144 01/18/2010   K 5.5* 01/25/2012   K 6.4* 01/24/2012   K 6.2* 01/24/2012   CL 114* 01/25/2012  CL 108 01/24/2012   CL 110 01/18/2010   CO2 19 01/25/2012   CO2 22 01/24/2012   CO2 26 01/18/2010   GLUCOSE 131* 01/25/2012   GLUCOSE 329* 01/24/2012   GLUCOSE 175* 01/18/2010   BUN 83* 01/25/2012   BUN 80* 01/24/2012   BUN 39* 01/18/2010   CREATININE 2.71* 01/25/2012   CREATININE 2.80* 01/24/2012   CREATININE 1.7* 01/18/2010   CALCIUM 7.9* 01/25/2012   CALCIUM 8.2* 01/24/2012   CALCIUM 9.1 01/18/2010   LFT  Basename 01/24/12 2023  PROT 6.5  ALBUMIN 3.4*  AST 16  ALT 13  ALKPHOS 104  BILITOT 0.2*  BILIDIR --  IBILI --   PT/INR Lab Results  Component Value Date   INR 1.18 01/24/2012   INR 1.19 11/04/2009   INR 1.8* 10/12/2007   RADIOLOGY STUDIES: Dg Chest Portable 1 View 01/24/2012  *RADIOLOGY REPORT*  Clinical Data: Cough and rectal bleeding.  PORTABLE CHEST - 1 VIEW  Comparison: None  Findings: Heart size and mediastinal contours appear normal.  There is no pleural effusion or pulmonary edema.  The lung volumes are low.  No airspace consolidation identified.  IMPRESSION:  1.  Low lung volumes. 2.  No pneumonia.  Original Report Authenticated By: Rosealee Albee, M.D.    ENDOSCOPIC STUDIES: 11/08/2009   EGD   Dr Marina Goodell 2 ulcers in duod bulb.  One with bleeding stigmata was injected with epi and endoclipped, the other was  clean based.   10/2009   Colonoscopy by Dr Norma Fredrickson in [facility]. Adenomatous polyps  05/2010 DG esophagus IMPRESSION:  1. Negative barium esophagram for age aside from gastroesophageal  reflux.  2. The stomach has a somewhat horizontal lie but no gastric hernia  is identified. Prompt gastric emptying is noted.   IMPRESSION: *  Upper GI bleed.  R/o recurrent ulcer *  ABL anemia.  S/p 2 units PRBCs.  *  IDDM, obesity. *  Diarrhea, chronic.  Mostly controlled with qid Lomotil.  *  Hx 10/2009 adenomatous colon polyp *  Chronic solid and liquid dysphagia.  For many years, no evidence of sticture on EGD or esophagram in past.   PLAN: *  EGD this afternoon.  Leave on PPI drip for now.  *  Follow the CBC.     LOS: 1 day   [name]  01/25/2012, 9:02 AM Pager: [phone]      ________________________________________________________________________  [facility] MD note:  I personally examined the patient, reviewed the data and agree with the assessment and plan described above.  No overt bleeding since 3pm yesterday and he's been HD stable so I suspect bleeding has stopped.  EGD today.  Stay on IV ppi for now.   Rob Bunting, MD [facility] Pager [phone]

## 2012-01-25 NOTE — H&P (Signed)
Name: Chad Carter MRN: [id number] DOB: 08/05/1934    LOS: 1  Referring Provider:  [facility] Reason for Referral:  Upper GI hemorrhage  PULMONARY / CRITICAL CARE MEDICINE  HPI:  78 yo with history of peptic ulcer disease and previous upper GI hemorrhage who presented to [facility] ED on 6/11 with several episodes of hematemesis and multiple tarry stools.  This was associated with epigastric abdominal pain aggravated by eating, which started several weeks ago.  There was no chest pain or dyspnea.  He denies anticoagulant, antiplatelet, NSAID or alcohol use.  Laboratory investigation also revealed acute on chronic renal failure and hyperkalemia. Brief patient description:  78 y/o with peptic ulcer disease admitted on 6/11 with hematemesis, tarry stools and epigastric abdominal pain.  Events Since Admission: 6/11  Admitted on 6/11 with hematemesis, tarry stools and epigastric abdominal pain. 6/12- hemodynamics are good, for egd  Current Status:  Vital Signs: Temp:  [97.3 F (36.3 C)-99.3 F (37.4 C)] 97.3 F (36.3 C) (06/12 0817) Pulse Rate:  [89-122] 95  (06/12 1100) Resp:  [14-30] 21  (06/12 1100) BP: (126-174)/(60-108) 148/68 mmHg (06/12 1100) SpO2:  [94 %-100 %] 97 % (06/12 1100) Weight:  [122.1 kg (269 lb 2.9 oz)-123.378 kg (272 lb)] 122.7 kg (270 lb 8.1 oz) (06/12 0500)  Physical Examination: General:  No acute distress, no longer anxious  Neuro:  Awake, alert, oriented HEENT:  Pink conjunctiva, dry membranes Neck:  No JVD Cardiovascular:  s1 s2 rrr no murmurs Lungs:  CTAB Abdomen:  Obese, bowel sounds present, nontender Musculoskeletal:  No edema Skin:  Intact  Active Problems:  Acute renal failure  Hyperkalemia  Acute upper GI hemorrhage  Acute blood loss anemia  Tachycardia  Peptic ulcer disease  Hyperglycemia  Diabetes mellitus  ASSESSMENT AND PLAN  PULMONARY No results found for this basename: PHART:5,PCO2:5,PCO2ART:5,PO2ART:5,HCO3:5,O2SAT:5 in the last 168  hours Ventilator Settings:   CXR:  NAD ETT:  NA  A:  No active issues P:   Supplemental oxygen PRN IS when able  CARDIOVASCULAR  Lab 01/24/12 2023  TROPONINI <0.30  LATICACIDVEN 1.6  PROBNP --   ECG:  6/11 >>> Sinus tachycardia, no ST-T changes Lines: NA  A: Sinus tachycardia resolved.  No evidence of ischemia.  Hemodynamically stable. P:  Telemetry  Two large bore PIVs Hold antihypertensives S/p 2 units prbc, cbc post  RENAL  Lab 01/25/12 0127 01/24/12 2118 01/24/12 2023  NA 145 -- 140  K 5.5* 6.4* --  CL 114* -- 108  CO2 19 -- 22  BUN 83* -- 80*  CREATININE 2.71* -- 2.80*  CALCIUM 7.9* -- 8.2*  MG -- -- --  PHOS -- -- --   Intake/Output      06/11 0701 - 06/12 0700 06/12 0701 - 06/13 0700   I.V. (mL/kg) 985.8 (8) 500 (4.1)   Blood 366.7    Total Intake(mL/kg) 1352.5 (11) 500 (4.1)   Urine (mL/kg/hr) 1250 (0.4)    Total Output 1250    Net +102.5 +500         Foley:  NA  A:  Acute on chronic renal failure.  Hyperkalemia, treated with glucose / insulin., NON AG small acidosis with K up,  P:   IVF to reduce BMP in pm after 2 further uinits, at risk hyperkalemia post products in setting CRI Avoid NS Low threshold bicarb if k rises in this setting  GASTROINTESTINAL  Lab 01/24/12 2023  AST 16  ALT 13  ALKPHOS 104  BILITOT  0.2*  PROT 6.5  ALBUMIN 3.4*   A:  Acute upper GI hemorrhage.  History of PUD (duodenal). P:   Protonix gtt now for  EGD NPO NGT if nausea / emesis  HEMATOLOGIC  Lab 01/25/12 0614 01/24/12 2023  HGB 8.5* 7.8*  HCT 24.9* 23.1*  PLT -- 212  INR -- 1.18  APTT -- --   A:  Acute blood loss anemia. P:  H&H q6h Transfuse 2 units PRBC, received hemodynamics are good, consider to sdu pending egd result  INFECTIOUS  Lab 01/24/12 2023  WBC 6.7  PROCALCITON --   Cultures: NA Antibiotics: Na  A:  No evidence of infection P:   No interventions required  ENDOCRINE  Lab 01/25/12 0346 01/24/12 2305  GLUCAP 85  195*   A:  Hyperglycemia.  History of DM.  Chronic testosterone supplementation.  Hypothyroidism. P:   Hold PO medications / Lantus for now, restart when able to take PO SSI/CBG  NEUROLOGIC  A:  No active issues P:   No interventions required  BEST PRACTICE / DISPOSITION Level of Care:  ICU, will re eval for sdu post egd Primary Service:  PCCM Consultants:  GI Code Status:  Full Diet:  NPO DVT Px:  SCDs GI Px:  Not indicated (on Protonix gtt) Skin Integrity:  No issues Social / Family:  Updated   Mcarthur Rossetti. Tyson Alias, MD, FACP Pgr: [phone] [facility]

## 2012-01-25 NOTE — Interval H&P Note (Signed)
History and Physical Interval Note:  01/25/2012 12:23 PM  Chad Carter  has presented today for surgery, with the diagnosis of gi bleed  The various methods of treatment have been discussed with the patient and family. After consideration of risks, benefits and other options for treatment, the patient has consented to  Procedure(s) (LRB): ESOPHAGOGASTRODUODENOSCOPY (EGD) (N/A) as a surgical intervention .  The patients' history has been reviewed, patient examined, no change in status, stable for surgery.  I have reviewed the patients' chart and labs.  Questions were answered to the patient's satisfaction.     Rob Bunting

## 2012-01-26 ENCOUNTER — Encounter (HOSPITAL_COMMUNITY): Payer: Self-pay | Admitting: Gastroenterology

## 2012-01-26 DIAGNOSIS — N289 Disorder of kidney and ureter, unspecified: Secondary | ICD-10-CM

## 2012-01-26 DIAGNOSIS — K922 Gastrointestinal hemorrhage, unspecified: Secondary | ICD-10-CM

## 2012-01-26 LAB — CBC
Hemoglobin: 7.6 g/dL — ABNORMAL LOW (ref 13.0–17.0)
MCH: 33.3 pg (ref 26.0–34.0)
MCH: 33.9 pg (ref 26.0–34.0)
MCHC: 34.2 g/dL (ref 30.0–36.0)
Platelets: 158 10*3/uL (ref 150–400)
Platelets: 165 10*3/uL (ref 150–400)
Platelets: 171 10*3/uL (ref 150–400)
RBC: 2.1 MIL/uL — ABNORMAL LOW (ref 4.22–5.81)
RBC: 2.21 MIL/uL — ABNORMAL LOW (ref 4.22–5.81)
RBC: 2.26 MIL/uL — ABNORMAL LOW (ref 4.22–5.81)
WBC: 6.3 10*3/uL (ref 4.0–10.5)
WBC: 7.6 10*3/uL (ref 4.0–10.5)

## 2012-01-26 LAB — COMPREHENSIVE METABOLIC PANEL
ALT: 9 U/L (ref 0–53)
AST: 13 U/L (ref 0–37)
CO2: 19 mEq/L (ref 19–32)
Calcium: 8.1 mg/dL — ABNORMAL LOW (ref 8.4–10.5)
GFR calc non Af Amer: 21 mL/min — ABNORMAL LOW (ref >90)
Sodium: 142 mEq/L (ref 135–145)
Total Protein: 5.5 g/dL — ABNORMAL LOW (ref 6.0–8.3)

## 2012-01-26 LAB — GLUCOSE, CAPILLARY
Glucose-Capillary: 130 mg/dL — ABNORMAL HIGH (ref 70–99)
Glucose-Capillary: 152 mg/dL — ABNORMAL HIGH (ref 70–99)
Glucose-Capillary: 174 mg/dL — ABNORMAL HIGH (ref 70–99)
Glucose-Capillary: 157 mg/dL — ABNORMAL HIGH (ref 70–99)
Glucose-Capillary: 167 mg/dL — ABNORMAL HIGH (ref 70–99)

## 2012-01-26 MED ORDER — PANTOPRAZOLE SODIUM 40 MG PO TBEC
40.0000 mg | DELAYED_RELEASE_TABLET | Freq: Two times a day (BID) | ORAL | Status: DC
  Administered 2012-01-26 – 2012-01-27 (×2): 40 mg via ORAL
  Filled 2012-01-26 (×2): qty 1

## 2012-01-26 MED ORDER — LEVOTHYROXINE SODIUM 75 MCG PO TABS
75.0000 ug | ORAL_TABLET | Freq: Every day | ORAL | Status: DC
  Administered 2012-01-26 – 2012-01-27 (×2): 75 ug via ORAL
  Filled 2012-01-26 (×4): qty 1

## 2012-01-26 NOTE — Progress Notes (Signed)
01/26/12  1200 Pt. Transferred from 2100 via w/c to 5523; wife with pt.; no problems;alert and oriented x3; skin intact;placed on tele.; home with wife and plan to return there.   Leandrew Koyanagi Leahann Lempke,RN

## 2012-01-26 NOTE — Progress Notes (Signed)
     Chad Carter Daily Rounding Note 01/26/2012, 7:58 AM  SUBJECTIVE:       Feels well.  No nausea, abd pain .  Not dizzy when he walks to bathroom.  Tolerating clears. 2 tarrry stools last PM.  None yet today.   OBJECTIVE:        General: looks better.      Vital signs in last 24 hours:    Temp:  [97.3 F (36.3 C)-98.7 F (37.1 C)] 97.9 F (36.6 C) (06/13 0409) Pulse Rate:  [83-106] 85  (06/13 0700) Resp:  [13-39] 14  (06/13 0700) BP: (108-151)/(46-81) 108/81 mmHg (06/13 0700) SpO2:  [92 %-100 %] 99 % (06/13 0700) Weight:  [270 lb 4.5 oz (122.6 kg)] 270 lb 4.5 oz (122.6 kg) (06/13 0500) Last BM Date: 01/24/12  Heart: RRR Chest: clear B.  No resp distress. Abdomen: soft, NT, ND, no HSM or mass  Extremities: no edema Neuro/Psych:  Pleasant, not confused or agitated.  No gross deficits.   Lab Results:  [facility] 01/26/12 01/25/12 1726 01/25/12 0614 01/24/12 2023  WBC 7.6 7.9 -- 6.7  HGB 7.5* 8.2* 8.5* --  HCT 21.4* 24.1* 24.9* --  PLT 158 171 -- 212   BMET  Basename 01/26/12 0340 01/25/12 1726 01/25/12 0127  NA 142 144 145  K 4.8 5.0 5.5*  CL 112 112 114*  CO2 19 20 19   GLUCOSE 132* 141* 131*  BUN 76* 82* 83*  CREATININE 2.77* 2.70* 2.71*  CALCIUM 8.1* 8.2* 7.9*   LFT  Basename 01/26/12 0340 01/24/12 2023  PROT 5.5* 6.5  ALBUMIN 2.9* 3.4*  AST 13 16  ALT 9 13  ALKPHOS 81 104  BILITOT 0.2* 0.2*  BILIDIR -- --  IBILI -- --    ASSESMENT: * Upper Carter bleed. recurrent ulcer, with clot, oozing blood. Treated with 3 endoclips.  On H2 blocker at home, now on IV Protonix drip. Clo test negative in 2011.  * ABL anemia. S/p 2 units PRBCs.  * IDDM, obesity.  * Diarrhea, chronic. Mostly controlled with qid Lomotil.  * Hx 10/2009 adenomatous colon polyp  * Chronic solid and liquid dysphagia. For many years, no evidence of sticture on EGD or esophagram in past, nor on 6/12 egd.   PLAN: *  Allow current bag of Protonix complete infusing and then change to BID po  Protonix.  *  Transfuse one more unit?  Will defer to Dr Tyson Alias.   *  Ok to transfer to floor, watch at least another 24 hours with serial CBCs.  Full liquid diet.    LOS: 2 days   Chad Carter  01/26/2012, 7:58 AM Pager: [phone]     ________________________________________________________________________  [facility] MD note:  I personally examined the patient, reviewed the data and agree with the assessment and plan described above.  He has has 3-4 melenic stools today.  This may simply be "old" blood working its way out but I'm going to order one more unit blood transfusion and will make him NPO after MN tonight for possible repeat EGD if it becomes more clear that he is continuing to bleed.   Rob Bunting, MD [facility] Pager [phone]

## 2012-01-26 NOTE — H&P (Signed)
Name: VITALIY EISENHOUR MRN: [id number] DOB: 06/16/1934    LOS: 2  Referring Provider:  [facility] Reason for Referral:  Upper GI hemorrhage  PULMONARY / CRITICAL CARE MEDICINE  HPI:  78 yo with history of peptic ulcer disease and previous upper GI hemorrhage who presented to [facility] ED on 6/11 with several episodes of hematemesis and multiple tarry stools.  This was associated with epigastric abdominal pain aggravated by eating, which started several weeks ago.  There was no chest pain or dyspnea.  He denies anticoagulant, antiplatelet, NSAID or alcohol use.  Laboratory investigation also revealed acute on chronic renal failure and hyperkalemia. Brief patient description:  78 y/o with peptic ulcer disease admitted on 6/11 with hematemesis, tarry stools and epigastric abdominal pain.  Events Since Admission: 6/11  Admitted on 6/11 with hematemesis, tarry stools and epigastric abdominal pain. 6/12- hemodynamics are good, for egd 6/12 egd - ulcer, clipped  Current Status: Hemodynamically stable  Vital Signs: Temp:  [97.7 F (36.5 C)-98.7 F (37.1 C)] 97.7 F (36.5 C) (06/13 0836) Pulse Rate:  [81-97] 81  (06/13 0800) Resp:  [13-39] 15  (06/13 0800) BP: (108-151)/(46-81) 120/48 mmHg (06/13 0800) SpO2:  [92 %-100 %] 100 % (06/13 0800) Weight:  [122.6 kg (270 lb 4.5 oz)] 122.6 kg (270 lb 4.5 oz) (06/13 0500)  Physical Examination: General:  No acute distress Neuro:  Awake, alert, oriented HEENT:  jvd wnl Neck:  No bruit Cardiovascular:  s1 s2 rrr no murmurs Lungs:  CTAB Abdomen:  Obese, bowel sounds present, nontender, obese Musculoskeletal:  No edema Skin:  Intact  Active Problems:  Acute renal failure  Hyperkalemia  Acute upper GI hemorrhage  Acute blood loss anemia  Tachycardia  Peptic ulcer disease  Hyperglycemia  Diabetes mellitus  ASSESSMENT AND PLAN  PULMONARY No results found for this basename: PHART:5,PCO2:5,PCO2ART:5,PO2ART:5,HCO3:5,O2SAT:5 in the last 168  hours Ventilator Settings:   CXR:  NAD ETT:  NA  A:  No active issues P:   Last pcxr reviewed, limit gross pos balance- kvo IS when able  CARDIOVASCULAR  Lab 01/24/12 2023  TROPONINI <0.30  LATICACIDVEN 1.6  PROBNP --   ECG:  6/11 >>> Sinus tachycardia, no ST-T changes Lines: NA  A: Sinus tachycardia resolved.  No evidence of ischemia.  Hemodynamically stable. P:  Two large bore PIVs Hold antihypertensives, hold off on home enalapril (ARF) and hctz Cbc q12h Dc tele  RENAL  Lab 01/26/12 0340 01/25/12 1726 01/25/12 0127 01/24/12 2023  NA 142 144 145 140  K 4.8 5.0 -- --  CL 112 112 114* 108  CO2 19 20 19 22   BUN 76* 82* 83* 80*  CREATININE 2.77* 2.70* 2.71* 2.80*  CALCIUM 8.1* 8.2* 7.9* 8.2*  MG -- -- -- --  PHOS -- -- -- --   Intake/Output      06/12 0701 - 06/13 0700 06/13 0701 - 06/14 0700   P.O. 720    I.V. (mL/kg) 1725 (14.1) 150 (1.2)   Blood     Total Intake(mL/kg) 2445 (19.9) 150 (1.2)   Urine (mL/kg/hr) 1950 (0.7) 425   Total Output 1950 425   Net +495 -275        Stool Occurrence 1 x     Foley:  NA  A:  Acute on chronic renal failure., small NON AG  P:   IVF to reduce kvo BMP in am  1/2 NS Avoid NS  GASTROINTESTINAL  Lab 01/26/12 0340 01/24/12 2023  AST 13 16  ALT  9 13  ALKPHOS 81 104  BILITOT 0.2* 0.2*  PROT 5.5* 6.5  ALBUMIN 2.9* 3.4*   A:  Acute upper GI hemorrhage.  EGD - ulcer clip P:   Protonix gtt to q12h per GI Diet per GI  HEMATOLOGIC  Lab 01/26/12 0819 01/26/12 01/25/12 1726 01/25/12 0614 01/24/12 2023  HGB 7.6* 7.5* 8.2* 8.5* 7.8*  HCT 22.2* 21.4* 24.1* 24.9* 23.1*  PLT 165 158 171 -- 212  INR -- -- -- -- 1.18  APTT -- -- -- -- --   A:  Acute blood loss anemia.rise in hct P:  H&H q12h Hold off Tx if likely good EGD outcome Cbc in pm scd Hemodynamics good  INFECTIOUS  Lab 01/26/12 0819 01/26/12 01/25/12 1726 01/24/12 2023  WBC 6.9 7.6 7.9 6.7  PROCALCITON -- -- -- --    Cultures: NA Antibiotics: Na  A:  No evidence of infection P:   No interventions required  ENDOCRINE  Lab 01/26/12 0835 01/26/12 0407 01/26/12 0025 01/25/12 2021 01/25/12 1611  GLUCAP 152* 130* 136* 134* 137*   A:  Hyperglycemia.  History of DM.  Chronic testosterone supplementation.  Hypothyroidism. P:   Restart synthroid ssi  NEUROLOGIC  A:  No active issues P:   No interventions required upright when able chair  BEST PRACTICE / DISPOSITION Level of Care:  Ito floor, triad, thanks Primary Service:  PCCM Consultants:  GI Code Status:  Full Diet:  fulls 6/13 DVT Px:  SCDs GI Px:  ppi bid Skin Integrity:  No issues Social / Family:  Updated  To traid  Mcarthur Rossetti. Tyson Alias, MD, FACP Pgr: [phone] [facility]

## 2012-01-26 NOTE — Plan of Care (Signed)
Problem: Phase I Progression Outcomes Goal: OOB as tolerated unless otherwise ordered Outcome: Progressing Up to the chair

## 2012-01-27 DIAGNOSIS — N179 Acute kidney failure, unspecified: Secondary | ICD-10-CM

## 2012-01-27 DIAGNOSIS — K2901 Acute gastritis with bleeding: Secondary | ICD-10-CM

## 2012-01-27 DIAGNOSIS — R000 Tachycardia, unspecified: Secondary | ICD-10-CM

## 2012-01-27 DIAGNOSIS — E875 Hyperkalemia: Secondary | ICD-10-CM

## 2012-01-27 LAB — CBC
MCH: 32.7 pg (ref 26.0–34.0)
MCHC: 34.3 g/dL (ref 30.0–36.0)
Platelets: 170 10*3/uL (ref 150–400)
RBC: 2.45 MIL/uL — ABNORMAL LOW (ref 4.22–5.81)
RDW: 16.2 % — ABNORMAL HIGH (ref 11.5–15.5)

## 2012-01-27 LAB — TYPE AND SCREEN
ABO/RH(D): A POS
Antibody Screen: NEGATIVE
Unit division: 0

## 2012-01-27 LAB — BASIC METABOLIC PANEL
Calcium: 8.6 mg/dL (ref 8.4–10.5)
Creatinine, Ser: 2.76 mg/dL — ABNORMAL HIGH (ref 0.50–1.35)
GFR calc non Af Amer: 21 mL/min — ABNORMAL LOW (ref >90)
Glucose, Bld: 194 mg/dL — ABNORMAL HIGH (ref 70–99)
Sodium: 140 mEq/L (ref 135–145)

## 2012-01-27 LAB — GLUCOSE, CAPILLARY
Glucose-Capillary: 220 mg/dL — ABNORMAL HIGH (ref 70–99)
Glucose-Capillary: 250 mg/dL — ABNORMAL HIGH (ref 70–99)

## 2012-01-27 MED ORDER — INSULIN GLARGINE 100 UNIT/ML ~~LOC~~ SOLN: 15.0000 [IU] | Freq: Every day | SUBCUTANEOUS | Status: DC

## 2012-01-27 MED ORDER — ENALAPRIL-HYDROCHLOROTHIAZIDE 10-25 MG PO TABS: 1.0000 | ORAL_TABLET | Freq: Every day | ORAL | Status: DC

## 2012-01-27 MED ORDER — INSULIN GLARGINE 100 UNIT/ML ~~LOC~~ SOLN: 70.0000 [IU] | Freq: Every day | SUBCUTANEOUS | Status: DC

## 2012-01-27 MED ORDER — GLYBURIDE 2.5 MG PO TABS: 2.5000 mg | ORAL_TABLET | Freq: Every day | ORAL | Status: DC

## 2012-01-27 NOTE — Progress Notes (Signed)
     [facility] Daily Rounding Note 01/27/2012, 9:06 AM  SUBJECTIVE:       Stool at 10 PM 6/13 "10% black"  The remainder was brown. Not dizzy when standing.  Diet never advanced to fulls, so still on clears.   OBJECTIVE:        General: no distress.      Vital signs in last 24 hours:    Temp:  [97.5 F (36.4 C)-98.4 F (36.9 C)] 98.4 F (36.9 C) (06/14 0352) Pulse Rate:  [82-91] 91  (06/14 0352) Resp:  [14-20] 20  (06/14 0352) BP: (119-169)/(48-76) 158/71 mmHg (06/14 0352) SpO2:  [93 %-100 %] 93 % (06/14 0352) Weight:  [270 lb 12.8 oz (122.834 kg)] 270 lb 12.8 oz (122.834 kg) (06/14 0437) Last BM Date: 01/26/12  Heart: RRR Chest: clear B Abdomen: soft, NT, ND  Extremities: some pedal edema, non-pitting. Neuro/Psych:  Pleasant, not agitated or confused.   Lab Results:  Basename 01/27/12 0537 01/26/12 1844 01/26/12 0819  WBC 8.3 6.3 6.9  HGB 8.0* 7.0* 7.6*  HCT 23.3* 20.5* 22.2*  PLT 170 171 165   BMET  Basename 01/27/12 0537 01/26/12 0340 01/25/12 1726  NA 140 142 144  K 4.5 4.8 5.0  CL 108 112 112  CO2 20 19 20   GLUCOSE 194* 132* 141*  BUN 70* 76* 82*  CREATININE 2.76* 2.77* 2.70*  CALCIUM 8.6 8.1* 8.2*   LFT  Basename 01/26/12 0340 01/24/12 2023  PROT 5.5* 6.5  ALBUMIN 2.9* 3.4*  AST 13 16  ALT 9 13  ALKPHOS 81 104  BILITOT 0.2* 0.2*  BILIDIR -- --  IBILI -- --    ASSESMENT: *  Coffee ground emesis.   EGD 6/13:  Recurrent duodenal bulb ulcer, oozing blood, treated with endoclips. Had bleeding ulcer 2011.  Home H2 blocker now upgraded to PPI.   * ABL anemia. S/p 2 units PRBCs. Hgb improved.  * IDDM, obesity.  * Diarrhea, chronic. Mostly controlled with qid Lomotil.  *  Chronic kidney dz. Numbers worse c/w 10/2009 * Hx 10/2009 adenomatous colon polyp  * Chronic solid and liquid dysphagia. For many years, no evidence of sticture on EGD or esophagram in past, nor on 6/12 egd.    PLAN: *  Continue BID po Protonix, may Rx PPI of least cost at  discharge home.  *  Has ROV for 7/16 with Dr Yancey Flemings   LOS: 3 days   [name]  01/27/2012, 9:06 AM Pager: [phone]    ________________________________________________________________________  [facility] MD note:  I personally examined the patient, reviewed the data and agree with the assessment and plan described above.  Stools getting brown and less frequent, last was over 12 hours ago.  HD stable and good bump in Hb.  Likely bleeding stopped. He is set up to see Dr. Marina Goodell next month.  Should stay on twice daily ppi until then.  Avoid nsaids.   Rob Bunting, MD [facility]astroenterology Pager [phone]

## 2012-01-27 NOTE — Progress Notes (Signed)
Chad Carter to be D/C'd Home per MD order.  Discussed with the patient and all questions fully answered.   [name], [name]  Home Medication Instructions ZOX:[id number]   Printed on:01/27/12 1207  Medication Information                    Testosterone (ANDROGEL PUMP) 20.25 MG/ACT (1.62%) GEL Place onto the skin.           augmented betamethasone dipropionate (DIPROLENE-AF) 0.05 % cream Apply topically 2 (two) times daily.           diphenoxylate-atropine (LOMOTIL) 2.5-0.025 MG per tablet Take 1 tablet by mouth 4 (four) times daily as needed.           gemfibrozil (LOPID) 600 MG tablet Take 600 mg by mouth 2 (two) times daily before a meal.           levothyroxine (SYNTHROID, LEVOTHROID) 75 MCG tablet Take 75 mcg by mouth daily.           ranitidine (ZANTAC) 150 MG tablet Take 150 mg by mouth 2 (two) times daily.           Insulin Syringe-Needle U-100 (RELION INSULIN SYR 1CC/30G) 30G X 5/16" 1 ML MISC by Does not apply route.           testosterone cypionate (DEPOTESTOTERONE CYPIONATE) 200 MG/ML injection Inject 100 mg into the muscle every 14 (fourteen) days.           enalapril-hydrochlorothiazide (VASERETIC) 10-25 MG per tablet Take 1 tablet by mouth daily.           insulin glargine (LANTUS OPTICLIK) 100 UNIT/ML injection Inject 70 Units into the skin at bedtime.           glyBURIDE (DIABETA) 2.5 MG tablet Take 1 tablet (2.5 mg total) by mouth daily with breakfast.             VVS, Skin clean, dry and intact without evidence of skin break down, no evidence of skin tears noted. IV catheter discontinued intact. Site without signs and symptoms of complications. Dressing and pressure applied.  An After Visit Summary was printed and given to the patient. Patient escorted via WC, and D/C home via wife picking him up.  [name], [name] 01/27/2012 12:07 PM

## 2012-01-27 NOTE — Care Management Note (Signed)
    Page 1 of 1   01/27/2012     2:38:35 PM   CARE MANAGEMENT NOTE 01/27/2012  Patient:  Chad Carter, Chad Carter   Account Number:  [id number]  Date Initiated:  01/25/2012  Documentation initiated by:  [facility]  Subjective/Objective Assessment:   upper GIB -  Has spouse. pta independent.     Action/Plan:   Anticipated DC Date:  01/27/2012   Anticipated DC Plan:  HOME/SELF CARE      DC Planning Services  CM consult      Choice offered to / List presented to:             Status of service:  Completed, signed off Medicare Important Message given?   (If response is "NO", the following Medicare IM given date fields will be blank) Date Medicare IM given:   Date Additional Medicare IM given:    Discharge Disposition:  HOME/SELF CARE  Per UR Regulation:  Reviewed for med. necessity/level of care/duration of stay  If discussed at Long Length of Stay Meetings, dates discussed:    Comments:  01/27/12 14:37 Letha Cape RN, BSN [phone] patient dc to home today, no needs identified.

## 2012-01-27 NOTE — Discharge Summary (Signed)
Physician Discharge Summary  Chad Carter AVW:[id number] DOB: Sep 08, 1933 DOA: 01/24/2012  PCP: No primary provider on file.  Admit date: 01/24/2012 Discharge date: 01/27/2012  Discharge Diagnoses:  Principal Problem:  *Peptic ulcer disease Active Problems:  Acute renal failure  Hyperkalemia  Acute upper GI hemorrhage  Acute blood loss anemia  Tachycardia  Hyperglycemia  Diabetes mellitus   Discharge Condition: Stable Disposition:  Follow-up Information    Follow up with Yancey Flemings, MD on 02/28/2012. (1:30 PM to follow up on ulcer)    Contact information:   [address] [facility] [address] [place] [address] [phone]         Diet: Regular diet  History of present illness:  78 yo with history of peptic ulcer disease and previous upper GI hemorrhage who presented to [facility] ED on 6/11 with several episodes of hematemesis and multiple tarry stools. This was associated with epigastric abdominal pain aggravated by eating, which started several weeks ago. There was no chest pain or dyspnea. He denies anticoagulant, antiplatelet, NSAID or alcohol use. Laboratory investigation also revealed acute on chronic renal failure and hyperkalemia.   Hospital Course:  Active Problems: Acute upper GI hemorrhage/ Acute blood loss anemia/Peptic ulcer disease -Patient was admitted to the ICU unit under the critical care service. He had to be aggressively resuscitated with IV fluid had one unit of packed red blood cells and started on protonic strip GI was consulted which recommended an EGD EGD was done that showed: Duodenal ulcer, oozing. This was treated with placement of endoclips. After the endoscopy his hemoglobin trended down to 7 so he was transfused a CBC was checked after this and HEENT hemoglobin remained at 8.0. His stools started getting clearer. On the day of discharge there was no melena. So he was discharged in stable condition. Will followup with his GI doctor as  an outpatient to  Acute on chronic renal failure: -This was most likely secondary to decreased intravascular volume secondary to #1. He was given IV fluid his blood pressure medications were stopped. And his creatinine started to trend down. The patient will start his blood pressure medication on at the end of this month. But then he should be seen by his primary care Chad Carter check a basic metabolic panel to see if his creatinine has returned to baseline to  Hyperkalemia: -This is probably secondary to the use acute renal failure. Had to be given glucose and insulin to decrease his hyperkalemia along with Kayexalate. He also had non-anion gap which is probably contributing to his hyperkalemia after all this is corrected including his acute renal failure his potassium returned to baseline.  Tachycardia: -This probably secondary to decreased intravascular volume, this resolved with aggressive fluid and volume resuscitation.  Diabetes mellitus -We have stopped his metformin, as he has a GFR less than 40. There is a risk of lactic acidosis with this. His Lantus was increased to 75. He would continue his glyburide. He'll continue check CBGs a.c. and at bedtime and take a record of this to his primary care Chadwho will add just his insulin as tolerated. There is blood glucose continues to be high at home, will consider starting him on a sliding scale insulin d/c the glipizide and increase Lantus.  Discharge Exam: Filed Vitals:   01/27/12 0918  BP: 148/88  Pulse: 88  Temp: 98 F (36.7 C)  Resp: 20   Filed Vitals:   01/27/12 0050 01/27/12 0352 01/27/12 6962 01/27/12 9528  BP: 169/76 158/71  148/88  Pulse: 86 91  88  Temp: 98.3 F (36.8 C) 98.4 F (36.9 C)  98 F (36.7 C)  TempSrc: Oral Oral  Axillary  Resp: 18 20  20   Height:      Weight:   122.834 kg (270 lb 12.8 oz)   SpO2:  93%  98%   Heart: RRR  Chest: clear B  Abdomen: soft, NT, ND   Discharge Instructions  Discharge Orders      Future Appointments: Provider: Department: Dept Phone: Center:   02/28/2012 1:30 PM Hilarie Fredrickson, MD [facility] Office [phone] [facility]     Future Orders Please Complete By Expires   Diet - low sodium heart healthy      Increase activity slowly        Medication List  As of 01/27/2012 10:58 AM   TAKE these medications         ANDROGEL PUMP 20.25 MG/ACT (1.62%) Gel   Generic drug: Testosterone   Place onto the skin.      augmented betamethasone dipropionate 0.05 % cream   Commonly known as: DIPROLENE-AF   Apply topically 2 (two) times daily.      diphenoxylate-atropine 2.5-0.025 MG per tablet   Commonly known as: LOMOTIL   Take 1 tablet by mouth 4 (four) times daily as needed.      enalapril-hydrochlorothiazide 10-25 MG per tablet   Commonly known as: VASERETIC   Take 1 tablet by mouth daily.   Start taking on: 02/12/2012      gemfibrozil 600 MG tablet   Commonly known as: LOPID   Take 600 mg by mouth 2 (two) times daily before a meal.      glyBURIDE-metformin 2.5-500 MG per tablet   Commonly known as: GLUCOVANCE   Take 1 tablet by mouth daily with breakfast.      LANTUS OPTICLIK 100 UNIT/ML injection   Generic drug: insulin glargine   Inject 40 Units into the skin at bedtime.      levothyroxine 75 MCG tablet   Commonly known as: SYNTHROID, LEVOTHROID   Take 75 mcg by mouth daily.      ranitidine 150 MG tablet   Commonly known as: ZANTAC   Take 150 mg by mouth 2 (two) times daily.      RELION INSULIN SYR 1CC/30G 30G X 5/16" 1 ML Misc   Generic drug: Insulin Syringe-Needle U-100   by Does not apply route.      testosterone cypionate 200 MG/ML injection   Commonly known as: DEPOTESTOTERONE CYPIONATE   Inject 100 mg into the muscle every 14 (fourteen) days.              The results of significant diagnostics from this hospitalization (including imaging, microbiology, ancillary and laboratory) are listed below for reference.    Procedure: ENDOSCOPIC  IMPRESSION:  1) Duodenal ulcer, oozing. This was treated with placement of endoclips.  Significant Diagnostic Studies: Dg Chest Portable 1 View  01/24/2012  *RADIOLOGY REPORT*  Clinical Data: Cough and rectal bleeding.  PORTABLE CHEST - 1 VIEW  Comparison: None  Findings: Heart size and mediastinal contours appear normal.  There is no pleural effusion or pulmonary edema.  The lung volumes are low.  No airspace consolidation identified.  IMPRESSION:  1.  Low lung volumes. 2.  No pneumonia.  Original Report Authenticated By: Rosealee Albee, M.D.    Microbiology: Recent Results (from the past 240 hour(s))  MRSA PCR SCREENING  Status: Normal   Collection Time   01/24/12 11:03 PM      Component Value Range Status Comment   MRSA by PCR NEGATIVE  NEGATIVE Final      Labs: Basic Metabolic Panel:  Lab 01/27/12 9604 01/26/12 0340 01/25/12 1726 01/25/12 0127 01/24/12 2023  NA 140 142 144 145 140  K 4.5 4.8 -- -- --  CL 108 112 112 114* 108  CO2 20 19 20 19 22   GLUCOSE 194* 132* 141* 131* 329*  BUN 70* 76* 82* 83* 80*  CREATININE 2.76* 2.77* 2.70* 2.71* 2.80*  CALCIUM 8.6 8.1* 8.2* 7.9* 8.2*  MG -- -- -- -- --  PHOS -- -- -- -- --   Liver Function Tests:  Lab 01/26/12 0340 01/24/12 2023  AST 13 16  ALT 9 13  ALKPHOS 81 104  BILITOT 0.2* 0.2*  PROT 5.5* 6.5  ALBUMIN 2.9* 3.4*    Lab 01/24/12 2023  LIPASE 77*  AMYLASE --   No results found for this basename: AMMONIA:5 in the last 168 hours CBC:  Lab 01/27/12 0537 01/26/12 1844 01/26/12 0819 01/26/12 01/25/12 1726 01/24/12 2023  WBC 8.3 6.3 6.9 7.6 7.9 --  NEUTROABS -- -- -- -- -- 5.1  HGB 8.0* 7.0* 7.6* 7.5* 8.2* --  HCT 23.3* 20.5* 22.2* 21.4* 24.1* --  MCV 95.1 97.6 98.2 96.8 97.2 --  PLT 170 171 165 158 171 --   Cardiac Enzymes:  Lab 01/24/12 2023  CKTOTAL --  CKMB --  CKMBINDEX --  TROPONINI <0.30   BNP: No components found with this basename: POCBNP:5 CBG:  Lab 01/27/12 0825 01/27/12 0405 01/27/12 0025  01/26/12 2031 01/26/12 1659  GLUCAP 220* 166* 156* 167* 157*    Time coordinating discharge: Greater than 45 minute  Signed:  Marinda Elk  [facility] 01/27/2012, 10:58 AM

## 2012-02-01 ENCOUNTER — Telehealth: Payer: Self-pay | Admitting: Internal Medicine

## 2012-02-01 ENCOUNTER — Inpatient Hospital Stay (HOSPITAL_COMMUNITY)
Admission: EM | Admit: 2012-02-01 | Discharge: 2012-02-04 | DRG: 378 | Disposition: A | Discharge status: Home / Self Care | Payer: Medicare Other | Source: Ambulatory Visit | Attending: Internal Medicine | Admitting: Internal Medicine

## 2012-02-01 ENCOUNTER — Encounter: Payer: Self-pay | Admitting: Physician Assistant

## 2012-02-01 ENCOUNTER — Inpatient Hospital Stay (HOSPITAL_COMMUNITY): Payer: Medicare Other

## 2012-02-01 DIAGNOSIS — D62 Acute posthemorrhagic anemia: Secondary | ICD-10-CM | POA: Diagnosis present

## 2012-02-01 DIAGNOSIS — R739 Hyperglycemia, unspecified: Secondary | ICD-10-CM

## 2012-02-01 DIAGNOSIS — K2901 Acute gastritis with bleeding: Secondary | ICD-10-CM

## 2012-02-01 DIAGNOSIS — Z860100 Personal history of colon polyps, unspecified: Secondary | ICD-10-CM

## 2012-02-01 DIAGNOSIS — R000 Tachycardia, unspecified: Secondary | ICD-10-CM

## 2012-02-01 DIAGNOSIS — D510 Vitamin B12 deficiency anemia due to intrinsic factor deficiency: Secondary | ICD-10-CM

## 2012-02-01 DIAGNOSIS — N179 Acute kidney failure, unspecified: Secondary | ICD-10-CM | POA: Diagnosis present

## 2012-02-01 DIAGNOSIS — I10 Essential (primary) hypertension: Secondary | ICD-10-CM | POA: Diagnosis present

## 2012-02-01 DIAGNOSIS — G4730 Sleep apnea, unspecified: Secondary | ICD-10-CM | POA: Diagnosis present

## 2012-02-01 DIAGNOSIS — G4733 Obstructive sleep apnea (adult) (pediatric): Secondary | ICD-10-CM | POA: Diagnosis present

## 2012-02-01 DIAGNOSIS — M1990 Unspecified osteoarthritis, unspecified site: Secondary | ICD-10-CM

## 2012-02-01 DIAGNOSIS — R1310 Dysphagia, unspecified: Secondary | ICD-10-CM | POA: Diagnosis present

## 2012-02-01 DIAGNOSIS — E785 Hyperlipidemia, unspecified: Secondary | ICD-10-CM | POA: Diagnosis present

## 2012-02-01 DIAGNOSIS — K922 Gastrointestinal hemorrhage, unspecified: Secondary | ICD-10-CM | POA: Diagnosis present

## 2012-02-01 DIAGNOSIS — K264 Chronic or unspecified duodenal ulcer with hemorrhage: Principal | ICD-10-CM | POA: Diagnosis present

## 2012-02-01 DIAGNOSIS — E875 Hyperkalemia: Secondary | ICD-10-CM | POA: Diagnosis present

## 2012-02-01 DIAGNOSIS — Z79899 Other long term (current) drug therapy: Secondary | ICD-10-CM

## 2012-02-01 DIAGNOSIS — K219 Gastro-esophageal reflux disease without esophagitis: Secondary | ICD-10-CM

## 2012-02-01 DIAGNOSIS — E538 Deficiency of other specified B group vitamins: Secondary | ICD-10-CM | POA: Diagnosis present

## 2012-02-01 DIAGNOSIS — E119 Type 2 diabetes mellitus without complications: Secondary | ICD-10-CM | POA: Diagnosis present

## 2012-02-01 DIAGNOSIS — D649 Anemia, unspecified: Secondary | ICD-10-CM

## 2012-02-01 DIAGNOSIS — N189 Chronic kidney disease, unspecified: Secondary | ICD-10-CM | POA: Diagnosis present

## 2012-02-01 DIAGNOSIS — E039 Hypothyroidism, unspecified: Secondary | ICD-10-CM | POA: Diagnosis present

## 2012-02-01 DIAGNOSIS — E669 Obesity, unspecified: Secondary | ICD-10-CM | POA: Diagnosis present

## 2012-02-01 DIAGNOSIS — Z794 Long term (current) use of insulin: Secondary | ICD-10-CM

## 2012-02-01 DIAGNOSIS — N170 Acute kidney failure with tubular necrosis: Secondary | ICD-10-CM

## 2012-02-01 DIAGNOSIS — K589 Irritable bowel syndrome without diarrhea: Secondary | ICD-10-CM | POA: Diagnosis present

## 2012-02-01 DIAGNOSIS — I129 Hypertensive chronic kidney disease with stage 1 through stage 4 chronic kidney disease, or unspecified chronic kidney disease: Secondary | ICD-10-CM | POA: Diagnosis present

## 2012-02-01 HISTORY — DX: Type 2 diabetes mellitus without complications: E11.9

## 2012-02-01 HISTORY — DX: Type 2 diabetes mellitus without complications: Z79.4

## 2012-02-01 LAB — COMPREHENSIVE METABOLIC PANEL
ALT: 13 U/L (ref 0–53)
AST: 17 U/L (ref 0–37)
Albumin: 3.2 g/dL — ABNORMAL LOW (ref 3.5–5.2)
Alkaline Phosphatase: 99 U/L (ref 39–117)
Glucose, Bld: 241 mg/dL — ABNORMAL HIGH (ref 70–99)
Potassium: 5.2 mEq/L — ABNORMAL HIGH (ref 3.5–5.1)
Sodium: 140 mEq/L (ref 135–145)
Total Protein: 6.4 g/dL (ref 6.0–8.3)

## 2012-02-01 LAB — CBC
MCH: 32.5 pg (ref 26.0–34.0)
MCV: 101.2 fL — ABNORMAL HIGH (ref 78.0–100.0)
Platelets: 180 10*3/uL (ref 150–400)
RDW: 18.2 % — ABNORMAL HIGH (ref 11.5–15.5)

## 2012-02-01 MED ORDER — TRIAMCINOLONE ACETONIDE 0.5 % EX CREA
TOPICAL_CREAM | Freq: Two times a day (BID) | CUTANEOUS | Status: DC
  Filled 2012-02-01 (×2): qty 15

## 2012-02-01 MED ORDER — ONDANSETRON HCL 4 MG/2ML IJ SOLN: 4.0000 mg | Freq: Four times a day (QID) | INTRAMUSCULAR | Status: DC | PRN

## 2012-02-01 MED ORDER — ACETAMINOPHEN 325 MG PO TABS: 650.0000 mg | ORAL_TABLET | Freq: Four times a day (QID) | ORAL | Status: DC | PRN

## 2012-02-01 MED ORDER — LEVOTHYROXINE SODIUM 100 MCG IV SOLR
37.5000 ug | Freq: Every day | INTRAVENOUS | Status: DC
  Administered 2012-02-02 – 2012-02-03 (×2): 38 ug via INTRAVENOUS
  Filled 2012-02-01 (×3): qty 1.9

## 2012-02-01 MED ORDER — ACETAMINOPHEN 650 MG RE SUPP: 650.0000 mg | Freq: Four times a day (QID) | RECTAL | Status: DC | PRN

## 2012-02-01 MED ORDER — OXYCODONE HCL 5 MG PO TABS: 5.0000 mg | ORAL_TABLET | ORAL | Status: DC | PRN

## 2012-02-01 MED ORDER — INSULIN GLARGINE 100 UNIT/ML ~~LOC~~ SOLN
40.0000 [IU] | Freq: Every day | SUBCUTANEOUS | Status: DC
  Administered 2012-02-01 – 2012-02-03 (×3): 40 [IU] via SUBCUTANEOUS

## 2012-02-01 MED ORDER — ALUM & MAG HYDROXIDE-SIMETH 200-200-20 MG/5ML PO SUSP: 30.0000 mL | Freq: Four times a day (QID) | ORAL | Status: DC | PRN

## 2012-02-01 MED ORDER — SODIUM CHLORIDE 0.9 % IV SOLN: INTRAVENOUS | Status: DC

## 2012-02-01 MED ORDER — SODIUM CHLORIDE 0.9 % IV SOLN
INTRAVENOUS | Status: DC
  Administered 2012-02-01 – 2012-02-03 (×3): via INTRAVENOUS

## 2012-02-01 MED ORDER — INSULIN ASPART 100 UNIT/ML ~~LOC~~ SOLN
0.0000 [IU] | Freq: Three times a day (TID) | SUBCUTANEOUS | Status: DC
  Administered 2012-02-02 – 2012-02-03 (×3): 2 [IU] via SUBCUTANEOUS
  Administered 2012-02-03: 1 [IU] via SUBCUTANEOUS
  Administered 2012-02-04: 2 [IU] via SUBCUTANEOUS

## 2012-02-01 MED ORDER — GEMFIBROZIL 600 MG PO TABS
600.0000 mg | ORAL_TABLET | Freq: Two times a day (BID) | ORAL | Status: DC
  Administered 2012-02-02 – 2012-02-04 (×5): 600 mg via ORAL
  Filled 2012-02-01 (×10): qty 1

## 2012-02-01 MED ORDER — TESTOSTERONE 20.25 MG/ACT (1.62%) TD GEL: Freq: Every day | TRANSDERMAL | Status: DC

## 2012-02-01 MED ORDER — SODIUM CHLORIDE 0.9 % IV SOLN
80.0000 mg | Freq: Once | INTRAVENOUS | Status: AC
  Administered 2012-02-01: 80 mg via INTRAVENOUS
  Filled 2012-02-01: qty 80

## 2012-02-01 MED ORDER — DIPHENOXYLATE-ATROPINE 2.5-0.025 MG PO TABS: 1.0000 | ORAL_TABLET | Freq: Four times a day (QID) | ORAL | Status: DC | PRN

## 2012-02-01 MED ORDER — SODIUM CHLORIDE 0.9 % IV SOLN
8.0000 mg/h | INTRAVENOUS | Status: DC
  Administered 2012-02-01 – 2012-02-03 (×5): 8 mg/h via INTRAVENOUS
  Filled 2012-02-01 (×12): qty 80

## 2012-02-01 MED ORDER — ONDANSETRON HCL 4 MG PO TABS: 4.0000 mg | ORAL_TABLET | Freq: Four times a day (QID) | ORAL | Status: DC | PRN

## 2012-02-01 MED ORDER — SODIUM CHLORIDE 0.9 % IJ SOLN
3.0000 mL | Freq: Two times a day (BID) | INTRAMUSCULAR | Status: DC
  Administered 2012-02-01 – 2012-02-03 (×4): 3 mL via INTRAVENOUS

## 2012-02-01 NOTE — ED Notes (Signed)
Critical Lab reported to Dr.Pickering

## 2012-02-01 NOTE — ED Notes (Signed)
1st unit of blood complete. No reaction

## 2012-02-01 NOTE — ED Notes (Addendum)
PT. Reports rectal bleeding  Since Wed. Hxt of "bleeding ulcer". Was seen last Friday for rectal bleeding. I got better on Friday, but the stool is back to"black stools" today. Last episode aprrox 1625.  Pt. Reports  Right lower quadrant abdominal pain. Denies N/V. Reports "weakness all over x 1 week". Pt. Appears pale. A.O. X 4. NAD.

## 2012-02-01 NOTE — ED Notes (Signed)
Dark stool and weakness since Friday.

## 2012-02-01 NOTE — Telephone Encounter (Signed)
Patient's wife reports that patient is having black watery stools and is so weak he is unable to stand.  She reports he has not felt well since discharge, but feels worse now.  He attempted to shower this am and she had to help him out of the shower due to weakness.  He was recently admitted to [facility] for Upper GI bleed.  She is advised to take him back to the ER.  She verbalized understanding.

## 2012-02-01 NOTE — H&P (Signed)
Chad Carter MRN: [id number] DOB/AGE: Feb 06, 1934 78 y.o. Primary Care Physician:ESCAJEDA, Gerlene Burdock, MD Admit date: 02/01/2012 Chief Complaint: Black tarry stools/weakness/presyncope HPI:  Chad Carter is a pleasant 78 year old Caucasian gentleman with a history of peptic ulcer disease, prior GI hemorrhage, hypertension, hyperlipidemia, obesity, sleep apnea, diabetes, chronic renal insufficiency, hypothyroidism, B12 deficiency will add an acute GI bleed/anemia on 4 2011 with blood and ulcers noted on EGD. Patient had adenomatous colonic polyps 10/2009 per colonoscopy in [facility] Patient was seen by Dr. Marina Goodell 11/ 2011 -03//2012 for complaints of diarrhea. Patient was initially omeprazole which was discontinued and had been placed on Zantac and Lomotil. Patient was admitted on 01/24/2012 through 01/27/2012 with melanotic stools and hematemesis had an EGD which was done during that hospitalization which showed an oozing bulb a duodenal ulcer which was treated with 3 endoclips. Patient was transfused a total of 3 units packed red blood cells during the hospitalization was initially placed on a PPI drip and transitioned to Protonix orally twice daily. On day of discharge patient noted that his stools were brown and a hemoglobin at that time was 8.0. Patient was discharged on Zantac twice daily and he was also supposed to be on Protonix twice daily. Patient did state that the Protonix prescription had not been called in when he went to the [facility] and a such had not been able to use it. During the hospitalization patient also did have acute renal failure and hyperkalemia which responded to IV fluids and Kayexalate. On discharge patient had a BUN/creatinine of 70/2.7. Patient presents back to the ED on the day of admission with a 2 to three-day history of dark tarry stools, generalized weakness, presyncope, dizziness. Patient states that while she was taking a shower on the morning of admission he felt very weak and  had to hold onto something to prevent passing out. Patient also endorses some shortness of breath. Patient denies any chest pain, no fever, no chills, no nausea, no hematemesis. Patient does endorse her chronic diarrhea with some constipation, also endorses some dysuria and some lower abdominal pain. Patient denies any recent NSAID use. Patient had called his gastroenterologist office stating he had melanotic stools and generalized weakness and was asked to present to the ED. Patient was seen in the ED a CBC obtained in the ED showed a hemoglobin of 5.3. A be met done showed a potassium of 5.2 a BUN of 80 and a creatinine of 3.05. Will call to admit the patient for further evaluation and management. ED physician stated that he had spoken with Dr. Leone Payor of [facility] and the plan was to probably undergo an upper EGD in the morning.  Past Medical History  Diagnosis Date  . GI bleed 10/2009, 01/2010  . Diabetes mellitus type 2, insulin dependent   . Hypertension   . High cholesterol   . High triglycerides   . Obesity   . Sleep apnea, obstructive     never fitted for CPAP  . Duodenal ulcer with hemorrhage 10/2009, 01/2012  . Irritable bowel syndrome (IBS)     diarrhea predominent  . Anemia due to blood loss 10/2009, 01/2012    2 PRBCs 01/2012.   . Vitamin B 12 deficiency   . Hypothyroidism   . Chronic kidney disease   . Hx of adenomatous colonic polyps 10/2009    colonoscopy in [facility]    Past Surgical History  Procedure Date  . Cholecystectomy   . Arm surg   . Knee surgery   .  Ulcer surg   . Esophagogastroduodenoscopy 01/25/2012    01/25/2012 EGD : Rachael Fee, placed 3 endoclips to oozing DU. of bulb.     Prior to Admission medications   Medication Sig Start Date End Date Taking? Authorizing Provider  augmented betamethasone dipropionate (DIPROLENE-AF) 0.05 % cream Apply topically 2 (two) times daily.   Yes Historical Provider, MD  diphenoxylate-atropine (LOMOTIL) 2.5-0.025 MG per  tablet Take 1 tablet by mouth 4 (four) times daily as needed.   Yes Historical Provider, MD  enalapril-hydrochlorothiazide (VASERETIC) 10-25 MG per tablet Take 1 tablet by mouth daily. 02/12/12  Yes Marinda Elk, MD  gemfibrozil (LOPID) 600 MG tablet Take 600 mg by mouth 2 (two) times daily before a meal.   Yes Historical Provider, MD  glyBURIDE (DIABETA) 2.5 MG tablet Take 1 tablet (2.5 mg total) by mouth daily with breakfast. 01/27/12 01/26/13 Yes Marinda Elk, MD  insulin glargine (LANTUS OPTICLIK) 100 UNIT/ML injection Inject 70 Units into the skin at bedtime. 01/27/12  Yes Marinda Elk, MD  Insulin Syringe-Needle U-100 (RELION INSULIN SYR 1CC/30G) 30G X 5/16" 1 ML MISC by Does not apply route.   Yes Historical Provider, MD  levothyroxine (SYNTHROID, LEVOTHROID) 75 MCG tablet Take 75 mcg by mouth daily.   Yes Historical Provider, MD  ranitidine (ZANTAC) 150 MG tablet Take 150 mg by mouth 2 (two) times daily.   Yes Historical Provider, MD  Testosterone (ANDROGEL PUMP) 20.25 MG/ACT (1.62%) GEL Place onto the skin.   Yes Historical Provider, MD  testosterone cypionate (DEPOTESTOTERONE CYPIONATE) 200 MG/ML injection Inject 100 mg into the muscle every 14 (fourteen) days.   Yes Historical Provider, MD    Allergies:  Allergies  Allergen Reactions  . Niacin And Related Rash    History reviewed. No pertinent family history.  Social History:  reports that he has never smoked. He does not have any smokeless tobacco history on file. He reports that he does not drink alcohol or use illicit drugs.  ROS: All systems reviewed with the patient and was positive as per HPI otherwise all other systems are negative.  PHYSICAL EXAM: Blood pressure 132/60, pulse 83, temperature 98 F (36.7 C), temperature source Oral, resp. rate 21, SpO2 100.00%. General: Well-developed well-nourished in no acute cardiopulmonary distress. Patient is pale looking.  HEENT:  Normocephalic atraumatic. Pupils  equal round reactive to light and accommodation. Extraocular movements intact. Oropharynx is clear, no lesions, no exudates. Neck is supple with no lymphadenopathy.No bruits, no goiter. Heart: Regular rate and rhythm, without murmurs, rubs, gallops. Lungs: Clear to auscultation bilaterally. Abdomen: Soft, nondistended, positive bowel sounds, tenderness to palpation in the suprapubic region, left lower quadrant and right lower quadrant. Extremities: No clubbing cyanosis or edema with positive pedal pulses. Neuro: Alert and oriented x3. Cranial nerves II through XII are grossly intact. No focal deficits. Out of 5 bilateral upper extremity strength. Follow 5 bilateral lower extremity strength. Sensation is intact. Visual fields are intact. Gait not tested secondary to safety.     EKG: None  Recent Results (from the past 240 hour(s))  MRSA PCR SCREENING     Status: Normal   Collection Time   01/24/12 11:03 PM      Component Value Range Status Comment   MRSA by PCR NEGATIVE  NEGATIVE Final      Lab results:  Basename 02/01/12 1636  NA 140  K 5.2*  CL 108  CO2 15*  GLUCOSE 241*  BUN 80*  CREATININE 3.05*  CALCIUM  8.6  MG --  PHOS --    Basename 02/01/12 1636  AST 17  ALT 13  ALKPHOS 99  BILITOT 0.2*  PROT 6.4  ALBUMIN 3.2*   No results found for this basename: LIPASE:2,AMYLASE:2 in the last 72 hours  Basename 02/01/12 1636  WBC 9.5  NEUTROABS --  HGB 5.3*  HCT 16.5*  MCV 101.2*  PLT 180   No results found for this basename: CKTOTAL:3,CKMB:3,CKMBINDEX:3,TROPONINI:3 in the last 72 hours No components found with this basename: POCBNP:3 No results found for this basename: DDIMER in the last 72 hours No results found for this basename: HGBA1C:2 in the last 72 hours No results found for this basename: CHOL:2,HDL:2,LDLCALC:2,TRIG:2,CHOLHDL:2,LDLDIRECT:2 in the last 72 hours No results found for this basename: TSH,T4TOTAL,FREET3,T3FREE,THYROIDAB in the last 72 hours No  results found for this basename: VITAMINB12:2,FOLATE:2,FERRITIN:2,TIBC:2,IRON:2,RETICCTPCT:2 in the last 72 hours Imaging results:  Dg Chest Portable 1 View  01/24/2012  *RADIOLOGY REPORT*  Clinical Data: Cough and rectal bleeding.  PORTABLE CHEST - 1 VIEW  Comparison: None  Findings: Heart size and mediastinal contours appear normal.  There is no pleural effusion or pulmonary edema.  The lung volumes are low.  No airspace consolidation identified.  IMPRESSION:  1.  Low lung volumes. 2.  No pneumonia.  Original Report Authenticated By: Rosealee Albee, M.D.   Impression/Plan:  Principal Problem:  *Acute upper GI hemorrhage Active Problems:  HYPOTHYROIDISM  DIABETES MELLITUS-TYPE II  HYPERLIPIDEMIA  HYPERTENSION  GERD  ULCER-DUODENAL  SLEEP APNEA  DYSPHAGIA UNSPECIFIED  Diarrhea  Acute renal failure  Hyperkalemia  Acute blood loss anemia  Peptic ulcer disease  Diabetes mellitus   #1 acute upper GI bleed Patient with probable acute upper GI bleed. Patient was recently admitted for upper GI bleed 611 through 01/27/2012 and was noted to have a duodenal ulcer that was oozing blood status post 3 endoclips. Since discharge patient's hemoglobin has dropped from 8.0 on 01/27/2012 down to 5.3 on day of admission 02/01/2012. Patient is a pale looking. And does endorse melanotic stools. Will admit the patient to the step down unit. We'll place on clears for now. We'll make n.p.o. after midnight. Place on IV fluids. Patient is being transfused 3 units of packed red blood cells. We'll place on a Protonix drip. GI has been consulted and will patient will probably undergo upper endoscopy in the morning. We'll monitor and follow. If patient continues bleeding overnight and becomes hemodynamically unstable will need to call GI to see patient ASAP.  #2 acute on chronic renal insufficiency Creatinine on discharge on 01/27/2012 was approximately 2.7. Likely secondary to prerenal azotemia secondary to  volume depletion secondary to problem #1 in the setting of ACE inhibitor and diuretic. Will hold patient's antihypertensive medications. Will check a urine sodium and a urine creatinine. I will follow renal function with gentle hydration. Monitor for volume overload. If renal function worsens may consider a renal ultrasound.  #3 hyperkalemia Likely secondary to problem #2. Will repeat BMET in the morning and if still elevated will give some Kayexalate and insulin.  #4 hypertension Stable. Will hold blood pressure medications for now. Monitor. Once blood pressure medications are resume will not recommend resuming HCTZ as renal function is greater than 1.5. And may consider resuming ACE inhibitor one to 2 weeks post discharge. If blood pressure management is needed once patient is over this acute issues may consider starting patient on Norvasc.  #5 chronic diarrhea Stable. Imodium as needed.  #6 acute blood loss anemia Secondary  to problem #1. See problem #1.  #7 history of recent duodenal ulcer status post endoclips x3  see problem #1. Continue Protonix drip. On discharge will need to ensure that patient is discharged on twice daily PPI.  #8 diabetes mellitus type 2 Will hold all patient's oral hypoglycemic agents. We'll place on half his home dose of Lantus are approximately 40 units daily as patient is on a be n.p.o. after midnight. We'll place on a sliding scale insulin. We'll not resume metformin on discharge.  #9 hypothyroidism Check a TSH. We'll place on IV Synthroid.  #10 gastroesophageal reflux disease PPI  #11 prophylaxis PPI for GI prophylaxis. SCDs for DVT prophylaxis.   Carolann Brazell [phone] 02/01/2012, 8:08 PM

## 2012-02-02 ENCOUNTER — Encounter (HOSPITAL_COMMUNITY)
Admission: EM | Disposition: A | Discharge status: Home / Self Care | Payer: Self-pay | Source: Ambulatory Visit | Attending: Internal Medicine

## 2012-02-02 ENCOUNTER — Encounter (HOSPITAL_COMMUNITY): Payer: Self-pay | Admitting: Internal Medicine

## 2012-02-02 DIAGNOSIS — K2901 Acute gastritis with bleeding: Secondary | ICD-10-CM

## 2012-02-02 DIAGNOSIS — N170 Acute kidney failure with tubular necrosis: Secondary | ICD-10-CM

## 2012-02-02 DIAGNOSIS — D649 Anemia, unspecified: Secondary | ICD-10-CM

## 2012-02-02 DIAGNOSIS — K922 Gastrointestinal hemorrhage, unspecified: Secondary | ICD-10-CM

## 2012-02-02 DIAGNOSIS — D62 Acute posthemorrhagic anemia: Secondary | ICD-10-CM

## 2012-02-02 DIAGNOSIS — E875 Hyperkalemia: Secondary | ICD-10-CM

## 2012-02-02 DIAGNOSIS — K264 Chronic or unspecified duodenal ulcer with hemorrhage: Principal | ICD-10-CM

## 2012-02-02 HISTORY — PX: ESOPHAGOGASTRODUODENOSCOPY: SHX5428

## 2012-02-02 LAB — HEMOGLOBIN A1C
Hgb A1c MFr Bld: 6 % — ABNORMAL HIGH (ref <5.7)
Mean Plasma Glucose: 126 mg/dL — ABNORMAL HIGH (ref <117)

## 2012-02-02 LAB — PROTIME-INR
INR: 1.18 (ref 0.00–1.49)
Prothrombin Time: 15.3 seconds — ABNORMAL HIGH (ref 11.6–15.2)

## 2012-02-02 LAB — URINALYSIS, ROUTINE W REFLEX MICROSCOPIC
Bilirubin Urine: NEGATIVE
Glucose, UA: NEGATIVE mg/dL
Nitrite: NEGATIVE
Specific Gravity, Urine: 1.013 (ref 1.005–1.030)
pH: 5 (ref 5.0–8.0)

## 2012-02-02 LAB — COMPREHENSIVE METABOLIC PANEL
AST: 15 U/L (ref 0–37)
CO2: 18 mEq/L — ABNORMAL LOW (ref 19–32)
Calcium: 8 mg/dL — ABNORMAL LOW (ref 8.4–10.5)
Creatinine, Ser: 2.94 mg/dL — ABNORMAL HIGH (ref 0.50–1.35)
GFR calc Af Amer: 22 mL/min — ABNORMAL LOW (ref >90)
GFR calc non Af Amer: 19 mL/min — ABNORMAL LOW (ref >90)
Glucose, Bld: 146 mg/dL — ABNORMAL HIGH (ref 70–99)

## 2012-02-02 LAB — GLUCOSE, CAPILLARY
Glucose-Capillary: 91 mg/dL (ref 70–99)
Glucose-Capillary: 75 mg/dL (ref 70–99)
Glucose-Capillary: 65 mg/dL — ABNORMAL LOW (ref 70–99)
Glucose-Capillary: 172 mg/dL — ABNORMAL HIGH (ref 70–99)

## 2012-02-02 LAB — CREATININE, URINE, RANDOM: Creatinine, Urine: 36.41 mg/dL

## 2012-02-02 LAB — TSH: TSH: 2.315 u[IU]/mL (ref 0.350–4.500)

## 2012-02-02 LAB — CBC
Hemoglobin: 7.1 g/dL — ABNORMAL LOW (ref 13.0–17.0)
RBC: 2.3 MIL/uL — ABNORMAL LOW (ref 4.22–5.81)

## 2012-02-02 LAB — HEMOGLOBIN AND HEMATOCRIT, BLOOD: Hemoglobin: 8.6 g/dL — ABNORMAL LOW (ref 13.0–17.0)

## 2012-02-02 LAB — SODIUM, URINE, RANDOM: Sodium, Ur: 77 mEq/L

## 2012-02-02 SURGERY — EGD (ESOPHAGOGASTRODUODENOSCOPY)
Anesthesia: Moderate Sedation

## 2012-02-02 MED ORDER — FENTANYL NICU IV SYRINGE 50 MCG/ML
INJECTION | INTRAMUSCULAR | Status: DC | PRN
  Administered 2012-02-02 (×2): 25 ug via INTRAVENOUS

## 2012-02-02 MED ORDER — MIDAZOLAM HCL 10 MG/2ML IJ SOLN
INTRAMUSCULAR | Status: AC
  Filled 2012-02-02: qty 2

## 2012-02-02 MED ORDER — BUTAMBEN-TETRACAINE-BENZOCAINE 2-2-14 % EX AERO
INHALATION_SPRAY | CUTANEOUS | Status: DC | PRN
  Administered 2012-02-02: 2 via TOPICAL

## 2012-02-02 MED ORDER — LIVING WELL WITH DIABETES BOOK
Freq: Once | Status: AC
  Administered 2012-02-02: 19:00:00
  Filled 2012-02-02 (×2): qty 1

## 2012-02-02 MED ORDER — FENTANYL CITRATE 0.05 MG/ML IJ SOLN
INTRAMUSCULAR | Status: AC
  Filled 2012-02-02: qty 2

## 2012-02-02 MED ORDER — LIVING WELL WITH DIABETES BOOK
Freq: Once | Status: DC
  Filled 2012-02-02: qty 1

## 2012-02-02 MED ORDER — MIDAZOLAM HCL 10 MG/2ML IJ SOLN
INTRAMUSCULAR | Status: DC | PRN
  Administered 2012-02-02 (×2): 2 mg via INTRAVENOUS
  Administered 2012-02-02: 1 mg via INTRAVENOUS

## 2012-02-02 NOTE — Progress Notes (Signed)
Utilization Review Completed.  Chad Carter T.  02/02/2012  

## 2012-02-02 NOTE — Op Note (Signed)
[facility] [address] [place], [place]  [address]  ENDOSCOPY PROCEDURE REPORT  PATIENT:  Chad Carter, Chad Carter  MR#:  [id number] BIRTHDATE:  [birth year], 78 yrs. old  GENDER:  male  ENDOSCOPIST:  [name], MD, [facility]  PROCEDURE DATE:  02/02/2012 PROCEDURE:  EGD, diagnostic [phone] ASA CLASS:  Class III INDICATIONS:  melena had bleeding duodenal ulcer clipped 6/12, presented with recurrent melena, had been on H2 blocker not PPI  MEDICATIONS:   Fentanyl 50 mcg IV, Versed 5 mg IV TOPICAL ANESTHETIC:  Cetacaine Spray  DESCRIPTION OF PROCEDURE:   After the risks benefits and alternatives of the procedure were thoroughly explained, informed consent was obtained.  The EG-[id number] ([id number]) endoscope was introduced through the mouth and advanced to the second portion of the duodenum, without limitations.  The instrument was slowly withdrawn as the mucosa was fully examined. <<PROCEDUREIMAGES>>  An ulcer was found in the bulb of the duodenum. Linear ulcer about 6-7 mm maximum with surrounding edema. some contact heme but no visible vessel or other stigmata of bleeding.  Otherwise the examination was normal.    Retroflexed views revealed no abnormalities.    The scope was then withdrawn from the patient and the procedure completed.  COMPLICATIONS:  None  ENDOSCOPIC IMPRESSION: 1) Ulcer in the bulb of duodenum - smaller than 6/12 and no bleeding stigmata today 2) Otherwise normal examination RECOMMENDATIONS: 1) feed 2) IV PPI until tomorrow then po 3) home 6/22 if ok on bid po PPI 4) Remain off ASA 5) I have ordered H. pylori serology 6) will arrange outpatient GI follow-up  [name], MD, Clementeen Graham  n. eSIGNED:   [name] at 02/02/2012 01:11 PM  Belva Bertin, [phone]

## 2012-02-02 NOTE — Progress Notes (Signed)
[facility]  Interval History: Chad Carter is a pleasant 78 year old Caucasian gentleman with a history of peptic ulcer disease, prior GI hemorrhage, hypertension, hyperlipidemia, obesity, sleep apnea, diabetes, chronic renal insufficiency, hypothyroidism, B12 deficiency will add an acute GI bleed/anemia on 4 2011 with blood and ulcers noted on EGD. Patient had adenomatous colonic polyps 10/2009 per colonoscopy in [facility]. Patient was seen by Dr. Marina Goodell 11/ 2011 -03//2012 for complaints of diarrhea. Patient was initially omeprazole which was discontinued and had been placed on Zantac and Lomotil. Patient was admitted on 01/24/2012 through 01/27/2012 with melanotic stools and hematemesis had an EGD which was done during that hospitalization which showed an oozing bulb a duodenal ulcer which was treated with 3 endoclips. Patient was transfused a total of 3 units packed red blood cells during the hospitalization was initially placed on a PPI drip and transitioned to Protonix orally twice daily. On day of discharge patient noted that his stools were brown and a hemoglobin at that time was 8.0. Patient was discharged on Zantac twice daily and he was also supposed to be on Protonix twice daily. Patient did state that the Protonix prescription had not been called in [facility] and a such had not been able to use it. During the hospitalization patient also did have acute renal failure and hyperkalemia which responded to IV fluids and Kayexalate. On discharge patient had a BUN/creatinine of 70/2.7. Patient presents back to the ED on the day of admission with a 2 to three-day history of dark tarry stools, generalized weakness, presyncope, dizziness. Patient states that while she was taking a shower on the morning of admission he felt very weak and had to hold onto something to prevent passing out. Patient also endorses some shortness of breath. Patient denies any chest pain, no fever, no chills,  no nausea, no hematemesis. Patient does endorse her chronic diarrhea with some constipation, also endorses some dysuria and some lower abdominal pain. Patient denies any recent NSAID use. Patient had called his gastroenterologist office stating he had melanotic stools and generalized weakness and was asked to present to the ED. Patient was seen in the ED a CBC obtained in the ED showed a hemoglobin of 5.3. A be met done showed a potassium of 5.2 a BUN of 80 and a creatinine of 3.05  Subjective: Alert and endorses epigastric discomfort that radiates down towards the periumbilical region with eating. Previous dyspnea and fatigue have resolved. Denies dizziness or weakness upon standing. Still having black stools.  Objective: Vital signs in last 24 hours: Temp:  [97.6 F (36.4 C)-98.5 F (36.9 C)] 98.1 F (36.7 C) (06/20 1131) Pulse Rate:  [81-92] 81  (06/20 1131) Resp:  [14-25] 23  (06/20 1131) BP: (105-185)/(48-83) 131/63 mmHg (06/20 1131) SpO2:  [94 %-100 %] 98 % (06/20 1131) Weight:  [122.8 kg (270 lb 11.6 oz)-124 kg (273 lb 5.9 oz)] 124 kg (273 lb 5.9 oz) (06/20 0423) Weight change:  Last BM Date: 02/02/12 (black tarry)  Intake/Output from previous day: 06/19 0701 - 06/20 0700 In: 3181.7 [P.O.:1200; I.V.:931.7; Blood:1050] Out: 1350 [Urine:1350] Intake/Output this shift: Total I/O In: 312.5 [I.V.:300; Blood:12.5] Out: 100 [Urine:100]  General appearance: alert, cooperative, appears stated age, no distress and pale Resp: clear to auscultation bilaterally, RA Cardio: regular rate and rhythm, S1, S2 normal, no murmur, click, rub or gallop GI: soft, non-tender; bowel sounds normal; no masses,  no organomegaly Extremities: extremities normal, atraumatic, no cyanosis or edema Neurologic: Grossly normal  Lab Results:  Basename 02/02/12 0946 02/02/12 0410 02/01/12 1636  WBC -- 8.8 9.5  HGB 7.6* 7.1* --  HCT 22.2* 21.6* --  PLT -- 218 180   BMET  Basename 02/02/12 0410 02/01/12  1636  NA 140 140  K 4.5 5.2*  CL 110 108  CO2 18* 15*  GLUCOSE 146* 241*  BUN 70* 80*  CREATININE 2.94* 3.05*  CALCIUM 8.0* 8.6    Studies/Results: Acute Abdominal Series  02/01/2012  *RADIOLOGY REPORT*  Clinical Data: Lower abdominal pain.  ACUTE ABDOMEN SERIES (ABDOMEN 2 VIEW & CHEST 1 VIEW)  Comparison: Chest radiograph on 01/24/2012  Findings: Multiple mildly dilated small bowel loops are seen containing air fluid levels.  There is also mild gaseous distention of the colon which shows air fluid levels.  This is most consistent with an adynamic ileus.  There is no evidence of free intraperitoneal air.  No definite radiopaque calculi identified. Surgical clips are seen within the right upper quadrant from prior cholecystectomy.  Other surgical clips are also seen in the left upper and right lower quadrants.  Low lung volumes are seen with mild bibasilar atelectasis which is not significantly changed since prior study.  No evidence of pulmonary consolidation or pleural effusion.  Heart size is exaggerated by low lung volumes but is stable.  IMPRESSION:  1.  Adynamic ileus. 2.  Low lung volumes with bibasilar atelectasis.  Original Report Authenticated By: Danae Orleans, M.D.    Medications: I have reviewed the patient's current medications.  Assessment/Plan:  Principal Problem:  *Acute upper GI hemorrhage/ Duodenal ulcer with hemorrhage *Recently diagnosed with duodenal ulcer status post endoscopy with clipping *GI plan for endoscopy today *Continue Protonix infusion for a total of 72 hours then eventually will convert to twice a day PPI for 4-6 weeks followed by long-term daily PPI  Active Problems:  Acute blood loss anemia *Hemoglobin nadir this admission 5.3 and currently 7.6 *Additional 1 unit of packed red blood cells ordered for transfusion this morning with total 4 units receive this admission *Recommend keep hemoglobin between 8 and 9   HYPERTENSION *Current blood pressure  somewhat soft *Continue to hold home in the lateral and hydrochlorothiazide   Acute renal failure *Baseline creatinine over the past 2 years has ranged anywhere from 1.7 to 2.8 *Current creatinine stable but slightly elevated at 2.94 *BUN has also creased from 80 to 70 and likely was elevated related to GI bleeding but could also be evidence of iron depletion therefore we'll continue IV fluids    Hyperkalemia *Resolved after rehydration   Diabetes mellitus type II *Continue Lantus and sliding scale insulin *CBGs currently well controlled   HYPOTHYROIDISM *Continue Synthroid   HYPERLIPIDEMIA   GERD   SLEEP APNEA   Disposition *Remain in step down until bleeding improved.    LOS: 1 day   Junious Silk, ANP pager [phone]  [facility] 1 [email or web address] Password: [email or web address]  02/02/2012, 1:06 PM  I have examined the patient and reviewed the chart and agree with the above note which I have modified.   Calvert Cantor, MD [phone]

## 2012-02-02 NOTE — Progress Notes (Signed)
CBG:65 Treatment: 15 GM carbohydrate snack  Symptoms: None  Follow-up CBG: Time:1404 CBG Result:79  Possible Reasons for Event: Inadequate meal intake  Comments/MD notified:no    Riley Kill, Ermalene Searing

## 2012-02-02 NOTE — Plan of Care (Signed)
Problem: Undesirable Food Choices (NB-1.7) Goal: Nutrition education Formal process to instruct or train a patient/client in a skill or to impart knowledge to help patients/clients voluntarily manage or modify food choices and eating behavior to maintain or improve health.  Outcome: Completed/Met Date Met:  02/02/12 RD consulted for DM diet education. Pt with limited previous diet education, was not counting or limiting carbohydrates. RD went over foods with carbohydrates and amount to eat per meal/snack. RD provided pt with a DM type 2 carbohydrate counting hand out from the [employer]. Explained how to count carbohydrate, serving sizes, and eating out recommendations. Pt and his wife often eat out and buffet style restaurants, RD encouraged pt to limit how often they eat at those types of places and encouraged pt to be careful of portion sizes. Pt showed little interest in changing his eating habits despite being warned of the possible consequences, loss of vision, worsening kidney function, ETC. Pt wife stated they know what they need to do, now they just need the willpower to make the change. RD encouraged them to notify staff of any additional education needs/questions. RD expects fair compliance.  Body mass index is 37.08 kg/(m^2). Pt is obese, encourage weight loss.  Lab Results  Component Value Date    HGBA1C 6.0* 02/02/2012   Chart reviewed, no additional nutrition interventions at this time. Please re-consult if needed.   Chad Carter  [phone]

## 2012-02-02 NOTE — Consult Note (Signed)
Referring Provider:Dr.  Janee Morn Primary Care Physician:  Tarri Fuller, MD Primary Gastroenterologist:  Dr.John Marina Goodell  Reason for Consultation:  Melena, weakness, severe anemia  HPI: Chad Carter is a 78 y.o. male  known to Dr. Yancey Flemings history of previous major upper GI bleed in 2011 secondary to a duodenal ulcer. He also has had problems with chronic diarrhea and maintained on Lomotil area This is felt to be secondary to IBS. The patient also has history of adult onset diabetes mellitus hyperlipidemia sleep apnea thyroidism and chronic kidney disease. He was hospitalized last week with another acute upper GI bleed and underwent upper endoscopy with Dr. Christella Hartigan on 01/25/2012 with finding of a duodenal ulcer which was actively oozing. He had 3 endoclips placed with good hemostasis. Patient was discharged home on Friday 6/14 2013  At that time it was felt that his bleeding has stopped, he had been given a total of 3 units of packed RBCs globin was 8 at the time of discharge . Unfortunately he was discharged home on Zantac twice daily PPI for reasons that are unclear appear He says after he returned home he started having black stools again with 1 bowel movement daily. He says his stools got progressively blacker this week and then yesterday he had to grossly tarry bowel movements in the morning. He complained of increased weakness, had tried to take a shower but felt as if he was going to pass out. His wife called her office and they were biased him to the emergency room. After a prolonged weight in the emergency room hemoglobin was found to be 5.3 last evening and he has been admitted and transfused. He says he has had some mild mid abdominal discomfort but no nausea or vomiting since discharge. He had been taking the Zantac and had not been on any aspirin or NSAIDs. This morning his hemoglobin is 7.1  Hx reviewed and same [name], MD, [facility]   Past Medical History  Diagnosis Date  .  GI bleed 10/2009, 01/2010  . Diabetes mellitus type 2, insulin dependent   . Hypertension   . High cholesterol   . High triglycerides   . Obesity   . Sleep apnea, obstructive     never fitted for CPAP  . Duodenal ulcer with hemorrhage 10/2009, 01/2012  . Irritable bowel syndrome (IBS)     diarrhea predominent  . Anemia due to blood loss 10/2009, 01/2012    2 PRBCs 01/2012.   . Vitamin B 12 deficiency   . Hypothyroidism   . Chronic kidney disease   . Hx of adenomatous colonic polyps 10/2009    colonoscopy in [facility]    Past Surgical History  Procedure Date  . Cholecystectomy   . Arm surg   . Knee surgery   . Ulcer surg   . Esophagogastroduodenoscopy 01/25/2012    01/25/2012 EGD : Rachael Fee, placed 3 endoclips to oozing DU. of bulb.     Prior to Admission medications   Medication Sig Start Date End Date Taking? Authorizing Provider  augmented betamethasone dipropionate (DIPROLENE-AF) 0.05 % cream Apply topically 2 (two) times daily.   Yes Historical Provider, MD  diphenoxylate-atropine (LOMOTIL) 2.5-0.025 MG per tablet Take 1 tablet by mouth 4 (four) times daily as needed.   Yes Historical Provider, MD  enalapril-hydrochlorothiazide (VASERETIC) 10-25 MG per tablet Take 1 tablet by mouth daily. 02/12/12  Yes Marinda Elk, MD  gemfibrozil (LOPID) 600 MG tablet Take 600 mg by mouth  2 (two) times daily before a meal.   Yes Historical Provider, MD  glyBURIDE (DIABETA) 2.5 MG tablet Take 1 tablet (2.5 mg total) by mouth daily with breakfast. 01/27/12 01/26/13 Yes Marinda Elk, MD  insulin glargine (LANTUS OPTICLIK) 100 UNIT/ML injection Inject 70 Units into the skin at bedtime. 01/27/12  Yes Marinda Elk, MD  Insulin Syringe-Needle U-100 (RELION INSULIN SYR 1CC/30G) 30G X 5/16" 1 ML MISC by Does not apply route.   Yes Historical Provider, MD  levothyroxine (SYNTHROID, LEVOTHROID) 75 MCG tablet Take 75 mcg by mouth daily.   Yes Historical Provider, MD  ranitidine  (ZANTAC) 150 MG tablet Take 150 mg by mouth 2 (two) times daily.   Yes Historical Provider, MD  Testosterone (ANDROGEL PUMP) 20.25 MG/ACT (1.62%) GEL Place onto the skin.   Yes Historical Provider, MD  testosterone cypionate (DEPOTESTOTERONE CYPIONATE) 200 MG/ML injection Inject 100 mg into the muscle every 14 (fourteen) days.   Yes Historical Provider, MD    Current Facility-Administered Medications  Medication Dose Route Frequency Provider Last Rate Last Dose  . 0.9 %  sodium chloride infusion   Intravenous Continuous Rodolph Bong, MD 75 mL/hr at 02/01/12 2141    . acetaminophen (TYLENOL) tablet 650 mg  650 mg Oral Q6H PRN Rodolph Bong, MD       Or  . acetaminophen (TYLENOL) suppository 650 mg  650 mg Rectal Q6H PRN Rodolph Bong, MD      . alum & mag hydroxide-simeth (MAALOX/MYLANTA) 200-200-20 MG/5ML suspension 30 mL  30 mL Oral Q6H PRN Rodolph Bong, MD      . diphenoxylate-atropine (LOMOTIL) 2.5-0.025 MG per tablet 1 tablet  1 tablet Oral QID PRN Rodolph Bong, MD      . gemfibrozil (LOPID) tablet 600 mg  600 mg Oral BID AC Rodolph Bong, MD   600 mg at 02/02/12 0735  . insulin aspart (novoLOG) injection 0-9 Units  0-9 Units Subcutaneous TID WC Rodolph Bong, MD      . insulin glargine (LANTUS) injection 40 Units  40 Units Subcutaneous QHS Rodolph Bong, MD   40 Units at 02/01/12 2315  . levothyroxine (SYNTHROID, LEVOTHROID) injection 38 mcg  38 mcg Intravenous QAC breakfast Rodolph Bong, MD   38 mcg at 02/02/12 [phone]  . ondansetron (ZOFRAN) tablet 4 mg  4 mg Oral Q6H PRN Rodolph Bong, MD       Or  . ondansetron [facility]) injection 4 mg  4 mg Intravenous Q6H PRN Rodolph Bong, MD      . oxyCODONE (Oxy IR/ROXICODONE) immediate release tablet 5 mg  5 mg Oral Q4H PRN Rodolph Bong, MD      . pantoprazole (PROTONIX) 80 mg in sodium chloride 0.9 % 100 mL IVPB  80 mg Intravenous Once Harrold Donath R. Pickering, MD   80 mg at 02/01/12 2102  .  pantoprazole (PROTONIX) 80 mg in sodium chloride 0.9 % 250 mL infusion  8 mg/hr Intravenous Continuous Juliet Rude. Rubin Payor, MD 25 mL/hr at 02/02/12 0812 8 mg/hr at 02/02/12 0812  . sodium chloride 0.9 % injection 3 mL  3 mL Intravenous Q12H Rodolph Bong, MD   3 mL at 02/01/12 2315  . Testosterone 20.25 MG/ACT (1.62%) GEL   Transdermal Daily Rodolph Bong, MD      . triamcinolone cream (KENALOG) 0.5 %   Topical BID Rodolph Bong, MD      . DISCONTD: 0.9 %  sodium  chloride infusion   Intravenous Continuous Rodolph Bong, MD        Allergies as of 02/01/2012 - Review Complete 02/01/2012  Allergen Reaction Noted  . Niacin and related Rash 01/24/2012    History reviewed. No pertinent family history.  History   Social History  . Marital Status: Married    Spouse Name: N/A    Number of Children: N/A  . Years of Education: N/A   Occupational History  . Not on file.   Social History Main Topics  . Smoking status: Never Smoker   . Smokeless tobacco: Not on file  . Alcohol Use: No  . Drug Use: No  . Sexually Active:    Other Topics Concern  . Not on file   Social History Narrative  . No narrative on file    Review of Systems: Pertinent positive and negative review of systems were noted in the above HPI section.  All other review of systems was otherwise negative.  Physical Exam: Vital signs in last 24 hours: Temp:  [97.6 F (36.4 C)-98.5 F (36.9 C)] 97.7 F (36.5 C) (06/20 0739) Pulse Rate:  [81-92] 87  (06/20 0739) Resp:  [14-25] 15  (06/20 0800) BP: (105-185)/(48-83) 148/61 mmHg (06/20 0800) SpO2:  [94 %-100 %] 98 % (06/20 0800) Weight:  [270 lb 11.6 oz (122.8 kg)-273 lb 5.9 oz (124 kg)] 273 lb 5.9 oz (124 kg) (06/20 0423) Last BM Date: 02/01/12 General:   Alert,  Well-developed, well-nourished, pleasant and cooperative in NAD very pleasant  Head:  Normocephalic and atraumatic. Eyes:  Sclera clear, no icterus.   Conjunctiva pale. Ears:  Normal  auditory acuity. Nose:  No deformity, discharge,  or lesions. Mouth:  No deformity or lesions.   Neck:  Supple; no masses or thyromegaly. Lungs:  Clear throughout to auscultation.   No wheezes, crackles, or rhonchi. Heart:  Regular rate and rhythm; no murmurs, clicks, rubs,  or gallops. Abdomen:  Soft,minimallytender   Epigastrium  BS active ,nonpalp mass or hsm.   Rectal:  Deferred  Msk:  Symmetrical without gross deformities. . Pulses:  Normal pulses noted. Extremities:  Without clubbing or edema. Neurologic:  Alert and  oriented x4;  grossly normal neurologically. Skin:  Intact without significant lesions or rashes.. Psych:  Alert and cooperative. Normal mood and affect.  Agree with findings mine are same [name], MD, [facility]  Intake/Output from previous day: 06/19 0701 - 06/20 0700 In: 3081.7 [P.O.:1200; I.V.:831.7; Blood:1050] Out: 1350 [Urine:1350] Intake/Output this shift:    Lab Results:  Basename 02/02/12 0410 02/01/12 1636  WBC 8.8 9.5  HGB 7.1* 5.3*  HCT 21.6* 16.5*  PLT 218 180   BMET  Basename 02/02/12 0410 02/01/12 1636  NA 140 140  K 4.5 5.2*  CL 110 108  CO2 18* 15*  GLUCOSE 146* 241*  BUN 70* 80*  CREATININE 2.94* 3.05*  CALCIUM 8.0* 8.6   LFT  Basename 02/02/12 0410  PROT 5.5*  ALBUMIN 2.8*  AST 15  ALT 11  ALKPHOS 91  BILITOT 0.3  BILIDIR --  IBILI --   PT/INR  Basename 02/02/12 0410 02/01/12 1636  LABPROT 15.3* 14.4  INR 1.18 1.10   Hepatitis Panel No results found for this basename: HEPBSAG,HCVAB,HEPAIGM,HEPBIGM in the last 72 hours    Studies/Results: Acute Abdominal Series  02/01/2012  *RADIOLOGY REPORT*  Clinical Data: Lower abdominal pain.  ACUTE ABDOMEN SERIES (ABDOMEN 2 VIEW & CHEST 1 VIEW)  Comparison: Chest radiograph on 01/24/2012  Findings:  Multiple mildly dilated small bowel loops are seen containing air fluid levels.  There is also mild gaseous distention of the colon which shows air fluid levels.  This is most  consistent with an adynamic ileus.  There is no evidence of free intraperitoneal air.  No definite radiopaque calculi identified. Surgical clips are seen within the right upper quadrant from prior cholecystectomy.  Other surgical clips are also seen in the left upper and right lower quadrants.  Low lung volumes are seen with mild bibasilar atelectasis which is not significantly changed since prior study.  No evidence of pulmonary consolidation or pleural effusion.  Heart size is exaggerated by low lung volumes but is stable.  IMPRESSION:  1.  Adynamic ileus. 2.  Low lung volumes with bibasilar atelectasis.  Original Report Authenticated By: Danae Orleans, M.D.    IMPRESSION:  #57 78 year old male with  recurrent  GI bleed very likely secondary to known duodenal ulcer with continued  oozing  #2  pro found anemia secondary to above (acute blood loss anemia) #3 chronic kidney disease  #4 history of acute GI bleed secondary to duodenal ulcer 2011-H. Pylori negative #5 chronic diarrhea/felt IBS etiology #6 onset diabetes mellitus #7 hypertension #8 sleep apnea  PLAN: #1 Protonix infusion x 72 hours ,then twice a day PPI for at least 4-6 weeks followed by long-term daily PPI  #2 transfuse 1 more unit this morning and plan to keep his hemoglobin between 8 and 9  #3 for upper endoscopy with Dr.  Leone Payor today  #4 check serum gastrin. Thanks we will follow with you.   [name]  02/02/2012, 9:13 AM    [facility] Attending  I have also seen and assessed the patient and agree with the above note. He has recurrent upper GI bleeding and needs re-evaluation with endoscopy and possible endoscopic therapy. The risks, benefits, and alternatives to endoscopy with possible biopsy and possible dilation were discussed with the patient and they consent to proceed.

## 2012-02-02 NOTE — Progress Notes (Signed)
02/02/12  Spoke with patient about his diabetes.  Was diagnosed about 15 years ago.  Sees Dr. Barbaraann Barthel in [place] for his diabetes control.  Was just here in hospital last week.  Was taking Lantus 70 units at bedtime, Glyburide 2.5 mg daily along with Metformin at home.   Metformin was discontinued.  States that he thinks he takes too much medication.  Will have staff RNs check patient on insulin administration and doing own CBGs.  States checks CBGs at home once per day unless "high". (greater than 200 mg/dl)  Ordered Living Well with Diabetes booklet and an RD consult.  Seemed to have many questions about his eating and carbohydrates.  Will continue to follow while in hospital.

## 2012-02-03 ENCOUNTER — Encounter (HOSPITAL_COMMUNITY): Payer: Self-pay | Admitting: Internal Medicine

## 2012-02-03 ENCOUNTER — Other Ambulatory Visit: Payer: Self-pay | Admitting: *Deleted

## 2012-02-03 DIAGNOSIS — E875 Hyperkalemia: Secondary | ICD-10-CM

## 2012-02-03 DIAGNOSIS — K2901 Acute gastritis with bleeding: Secondary | ICD-10-CM

## 2012-02-03 DIAGNOSIS — K264 Chronic or unspecified duodenal ulcer with hemorrhage: Secondary | ICD-10-CM

## 2012-02-03 DIAGNOSIS — N170 Acute kidney failure with tubular necrosis: Secondary | ICD-10-CM

## 2012-02-03 DIAGNOSIS — D649 Anemia, unspecified: Secondary | ICD-10-CM

## 2012-02-03 LAB — H. PYLORI ANTIBODY, IGG: H Pylori IgG: <0.4 {ISR}

## 2012-02-03 LAB — GLUCOSE, CAPILLARY
Glucose-Capillary: 165 mg/dL — ABNORMAL HIGH (ref 70–99)
Glucose-Capillary: 155 mg/dL — ABNORMAL HIGH (ref 70–99)
Glucose-Capillary: 140 mg/dL — ABNORMAL HIGH (ref 70–99)
Glucose-Capillary: 182 mg/dL — ABNORMAL HIGH (ref 70–99)

## 2012-02-03 LAB — URINE CULTURE
Colony Count: NO GROWTH
Culture  Setup Time: 201306200440

## 2012-02-03 LAB — HEMOGLOBIN AND HEMATOCRIT, BLOOD
HCT: 25 % — ABNORMAL LOW (ref 39.0–52.0)
Hemoglobin: 8.2 g/dL — ABNORMAL LOW (ref 13.0–17.0)

## 2012-02-03 MED ORDER — LEVOTHYROXINE SODIUM 75 MCG PO TABS
75.0000 ug | ORAL_TABLET | Freq: Every day | ORAL | Status: DC
  Administered 2012-02-04: 75 ug via ORAL
  Filled 2012-02-03 (×3): qty 1

## 2012-02-03 NOTE — Progress Notes (Signed)
[facility]  Interval History: Chad Carter is a pleasant 78 year old Caucasian gentleman with a history of peptic ulcer disease, prior GI hemorrhage, hypertension, hyperlipidemia, obesity, sleep apnea, diabetes, chronic renal insufficiency, hypothyroidism, B12 deficiency will add an acute GI bleed/anemia on 4 2011 with blood and ulcers noted on EGD. Patient had adenomatous colonic polyps 10/2009 per colonoscopy in [facility]. Patient was seen by Dr. Marina Goodell 11/ 2011 -03//2012 for complaints of diarrhea. Patient was initially omeprazole which was discontinued and had been placed on Zantac and Lomotil. Patient was admitted on 01/24/2012 through 01/27/2012 with melanotic stools and hematemesis had an EGD which was done during that hospitalization which showed an oozing bulb a duodenal ulcer which was treated with 3 endoclips. Patient was transfused a total of 3 units packed red blood cells during the hospitalization was initially placed on a PPI drip and transitioned to Protonix orally twice daily. On day of discharge patient noted that his stools were brown and a hemoglobin at that time was 8.0. Patient was discharged on Zantac twice daily and he was also supposed to be on Protonix twice daily. Patient did state that the Protonix prescription had not been called in when he went to the [facility] and a such had not been able to use it. During the hospitalization patient also did have acute renal failure and hyperkalemia which responded to IV fluids and Kayexalate. On discharge patient had a BUN/creatinine of 70/2.7. Patient presents back to the ED on the day of admission with a 2 to three-day history of dark tarry stools, generalized weakness, presyncope, dizziness. Patient states that while she was taking a shower on the morning of admission he felt very weak and had to hold onto something to prevent passing out. Patient also endorses some shortness of breath. Patient denies any chest pain, no fever, no chills,  no nausea, no hematemesis. Patient does endorse her chronic diarrhea with some constipation, also endorses some dysuria and some lower abdominal pain. Patient denies any recent NSAID use. Patient had called his gastroenterologist office stating he had melanotic stools and generalized weakness and was asked to present to the ED. Patient was seen in the ED a CBC obtained in the ED showed a hemoglobin of 5.3. A be met done showed a potassium of 5.2 a BUN of 80 and a creatinine of 3.05  Subjective: Alert and no further epigastric pain or dark stools. Wife had many questions regarding patient's stability for discharge home. Patient is without chest pain or shortness of breath  Objective: Vital signs in last 24 hours: Temp:  [97.4 F (36.3 C)-98.3 F (36.8 C)] 97.9 F (36.6 C) (06/21 0753) Pulse Rate:  [78-91] 82  (06/21 0753) Resp:  [7-50] 17  (06/21 0753) BP: (119-166)/(48-92) 128/54 mmHg (06/21 0753) SpO2:  [83 %-100 %] 100 % (06/21 0753) Weight change:  Last BM Date: 02/02/12  Intake/Output from previous day: 06/20 0701 - 06/21 0700 In: 4442.5 [P.O.:2030; I.V.:2400; Blood:12.5] Out: 1720 [Urine:1720] Intake/Output this shift: Total I/O In: 300 [I.V.:300] Out: 400 [Urine:400]  General appearance: alert, cooperative, appears stated age, no distress and pale Resp: clear to auscultation bilaterally, RA Cardio: regular rate and rhythm, S1, S2 normal, no murmur, click, rub or gallop GI: soft, non-tender; bowel sounds normal; no masses,  no organomegaly Extremities: extremities normal, atraumatic, no cyanosis or edema Neurologic: Grossly normal  Lab Results:  Basename 02/03/12 0829 02/02/12 2100 02/02/12 0410 02/01/12 1636  WBC -- -- 8.8 9.5  HGB 8.2* 8.6* -- --  HCT 25.0* 25.1* -- --  PLT -- -- 218 180   BMET  Basename 02/02/12 0410 02/01/12 1636  NA 140 140  K 4.5 5.2*  CL 110 108  CO2 18* 15*  GLUCOSE 146* 241*  BUN 70* 80*  CREATININE 2.94* 3.05*  CALCIUM 8.0* 8.6     Studies/Results: Acute Abdominal Series  02/01/2012  *RADIOLOGY REPORT*  Clinical Data: Lower abdominal pain.  ACUTE ABDOMEN SERIES (ABDOMEN 2 VIEW & CHEST 1 VIEW)  Comparison: Chest radiograph on 01/24/2012  Findings: Multiple mildly dilated small bowel loops are seen containing air fluid levels.  There is also mild gaseous distention of the colon which shows air fluid levels.  This is most consistent with an adynamic ileus.  There is no evidence of free intraperitoneal air.  No definite radiopaque calculi identified. Surgical clips are seen within the right upper quadrant from prior cholecystectomy.  Other surgical clips are also seen in the left upper and right lower quadrants.  Low lung volumes are seen with mild bibasilar atelectasis which is not significantly changed since prior study.  No evidence of pulmonary consolidation or pleural effusion.  Heart size is exaggerated by low lung volumes but is stable.  IMPRESSION:  1.  Adynamic ileus. 2.  Low lung volumes with bibasilar atelectasis.  Original Report Authenticated By: Danae Orleans, M.D.    Medications: I have reviewed the patient's current medications.  Assessment/Plan:  Principal Problem:  *Acute upper GI hemorrhage/ Duodenal ulcer with hemorrhage *Recently diagnosed with duodenal ulcer status post endoscopy with clipping *endoscopy 02/02/2012 showed no active bleeding and no explanation for patient's abrupt acute blood loss anemia *Continue Protonix infusion for a total of 72 hours then eventually will convert to twice a day PPI for 4-6 weeks followed by long-term daily PPI-office Will give patient Nexium samples  Active Problems:  Acute blood loss anemia *Hemoglobin nadir this admission 5.3 and currently is stable greater than 8 *Received a total of 4 units receive this admission *Recommend keep hemoglobin between 8 and 9 *Follow CBC daily   HYPERTENSION *Current blood pressure somewhat soft *Continue to hold home in the  enalapril and hydrochlorothiazide   Acute renal failure *Baseline creatinine over the past 2 years has ranged anywhere from 1.7 to 2.8 *Current creatinine stable but slightly elevated at 2.94 on 02/02/2012-repeat electrical panel the morning *BUN has also creased from 80 to 70 and likely was elevated related to GI bleeding but could also be evidence of volume depletion therefore we'll continue IV fluids    Hyperkalemia *Resolved after rehydration   Diabetes mellitus type II *Continue Lantus and sliding scale insulin *CBGs currently well controlled   HYPOTHYROIDISM *Continue Synthroid   HYPERLIPIDEMIA   GERD   SLEEP APNEA   Disposition *Transfer to medical floor   LOS: 2 days   Junious Silk, ANP pager [phone]  [facility] [email or web address] Password: [email or web address]  02/03/2012, 11:02 AM  I have examined the patient and reviewed the chart and agree with the above note which I have modified.   Calvert Cantor, MD [phone]

## 2012-02-03 NOTE — Progress Notes (Signed)
Pt to travel to 5502 via wheelchair. No O2, IV, no tele/RN. Family and belongings at side. Vitals stable upon transfer. No current questions or complaints.  Chad Carter

## 2012-02-03 NOTE — Discharge Instructions (Signed)
Come to Dr. Lamar Sprinkles office lab on [facility]. On Wednesday 7/3 in the morning for a CBC !

## 2012-02-03 NOTE — Progress Notes (Signed)
Patient ID: Chad Carter, male   DOB: Nov 19, 1933, 78 y.o.   MRN: [id number] [facility] Progress Note  Subjective: He feels good- no complaints,energy level back to baseline. Eating without difficulty. One small black stool last night.  Objective:  Vital signs in last 24 hours: Temp:  [97.4 F (36.3 C)-98.3 F (36.8 C)] 97.9 F (36.6 C) (06/21 0753) Pulse Rate:  [78-91] 82  (06/21 0753) Resp:  [7-50] 17  (06/21 0753) BP: (118-166)/(48-92) 128/54 mmHg (06/21 0753) SpO2:  [83 %-100 %] 100 % (06/21 0753) Last BM Date: 02/02/12 General:   Alert,  Well-developed,    in NAD Heart:  Regular rate and rhythm; no murmurs Pulm;clear Abdomen:  Soft, nontender and nondistended. Normal bowel sounds, without guarding, and without rebound.   Extremities:  Without edema. Neurologic:  Alert and  oriented x4;  grossly normal neurologically. Psych:  Alert and cooperative. Normal mood and affect.  Intake/Output from previous day: 06/20 0701 - 06/21 0700 In: 4342.5 [P.O.:2030; I.V.:2300; Blood:12.5] Out: 1720 [Urine:1720] Intake/Output this shift: Total I/O In: -  Out: 400 [Urine:400]  Lab Results:  [facility] 02/02/12 2100 02/02/12 0946 02/02/12 0410 02/01/12 1636  WBC -- -- 8.8 9.5  HGB 8.6* 7.6* 7.1* --  HCT 25.1* 22.2* 21.6* --  PLT -- -- 218 180   BMET  Basename 02/02/12 0410 02/01/12 1636  NA 140 140  K 4.5 5.2*  CL 110 108  CO2 18* 15*  GLUCOSE 146* 241*  BUN 70* 80*  CREATININE 2.94* 3.05*  CALCIUM 8.0* 8.6   LFT  Basename 02/02/12 0410  PROT 5.5*  ALBUMIN 2.8*  AST 15  ALT 11  ALKPHOS 91  BILITOT 0.3  BILIDIR --  IBILI --   PT/INR  Basename 02/02/12 0410 02/01/12 1636  LABPROT 15.3* 14.4  INR 1.18 1.10     Assessment / Plan: #1  78 yo  Male with recurrent bleed/ooze from duodenal ulcer- no active bleeding noted at EGD yesterday so no endoscopic therapy indicated.  Hpylori pending , gastrin pending. Would continue IV Protonix infusion until  tomorrow- if hgb  Stable should be OK for discharge tomorrow  I gave him samples of Nexium to use twice daily for next 3-4 weeks then he will switch to prilosec 20 mg daily. He will come top our office next week for repeat hgb/hct, and he has office follow up with Dr. Marina Goodell mid July No Nsaids. DrMann/. Elnoria Howard on this weekend if problems. #2 Anemia-acute blood loss on chronic- HGb much improved post 4 units of PRBc's     LOS: 2 days   Chad Carter  02/03/2012, 8:44 AM    [facility] Attending  I have also seen and assessed the patient and agree with the above note. Call us back if ?'s may have some more melena but if Hgb overall stable then ok to dc on PPI - he has samples  Chad Boop, MD, [name] [phone] (pager) 02/03/2012 2:09 PM

## 2012-02-03 NOTE — Progress Notes (Signed)
Tried to call report x1 

## 2012-02-04 LAB — BASIC METABOLIC PANEL
BUN: 48 mg/dL — ABNORMAL HIGH (ref 6–23)
Calcium: 8.3 mg/dL — ABNORMAL LOW (ref 8.4–10.5)
Creatinine, Ser: 3.01 mg/dL — ABNORMAL HIGH (ref 0.50–1.35)
GFR calc Af Amer: 22 mL/min — ABNORMAL LOW (ref >90)
GFR calc non Af Amer: 19 mL/min — ABNORMAL LOW (ref >90)

## 2012-02-04 LAB — FERRITIN: Ferritin: 156 ng/mL (ref 22–322)

## 2012-02-04 LAB — CBC
MCH: 31.3 pg (ref 26.0–34.0)
MCHC: 32.7 g/dL (ref 30.0–36.0)
RDW: 18.9 % — ABNORMAL HIGH (ref 11.5–15.5)

## 2012-02-04 LAB — IRON AND TIBC
Iron: 48 ug/dL (ref 42–135)
Saturation Ratios: 15 % — ABNORMAL LOW (ref 20–55)
TIBC: 315 ug/dL (ref 215–435)

## 2012-02-04 LAB — RETICULOCYTES: Retic Ct Pct: 4.4 % — ABNORMAL HIGH (ref 0.4–3.1)

## 2012-02-04 MED ORDER — ESOMEPRAZOLE MAGNESIUM 40 MG PO CPDR
40.0000 mg | DELAYED_RELEASE_CAPSULE | Freq: Two times a day (BID) | ORAL | Status: DC
  Filled 2012-02-04 (×2): qty 1

## 2012-02-04 MED ORDER — INSULIN GLARGINE 100 UNIT/ML ~~LOC~~ SOLN: 40.0000 [IU] | Freq: Every day | SUBCUTANEOUS | Status: AC

## 2012-02-04 MED ORDER — NON FORMULARY: 40.0000 mg | Freq: Two times a day (BID) | Status: DC

## 2012-02-04 NOTE — Progress Notes (Signed)
Pt. discharge to floor,verbalized understanding of discharged instruction,medication,restriction,diet and follow up appointment.Baseline Vitals sign stable,Pt comfortable,no sign and symptom of distress. 

## 2012-02-04 NOTE — Progress Notes (Signed)
Pt. Refuses to have a new IV put in. He would like to until the MD rounds in the morning.

## 2012-02-04 NOTE — ED Provider Notes (Signed)
History     CSN: [id number]  Arrival date & time 02/01/12  1510   First MD Initiated Contact with Patient 02/01/12 1719      Chief Complaint  Patient presents with  . Rectal Bleeding    (Consider location/radiation/quality/duration/timing/severity/associated sxs/prior treatment) Patient is a 78 y.o. male presenting with hematochezia. The history is provided by the patient.  Rectal Bleeding  Pertinent negatives include no abdominal pain, no diarrhea, no nausea, no vomiting, no chest pain, no headaches and no rash.   patient presents with dark stools and weakness since Friday. He said a recent GI bleed from an ulcer. No abdominal pain. No fevers. This feels like his previous GI bleed. He sees Adult nurse gastroenterology, and called him before arrival. He is on no blood thinners. He states he feels weak and doesn't think that he could walk very far.  Past Medical History  Diagnosis Date  . GI bleed 10/2009, 01/2010  . Diabetes mellitus type 2, insulin dependent   . Hypertension   . High cholesterol   . High triglycerides   . Obesity   . Sleep apnea, obstructive     never fitted for CPAP  . Duodenal ulcer with hemorrhage 10/2009, 01/2012  . Irritable bowel syndrome (IBS)     diarrhea predominent  . Anemia due to blood loss 10/2009, 01/2012    2 PRBCs 01/2012.   . Vitamin B 12 deficiency   . Hypothyroidism   . Chronic kidney disease   . Hx of adenomatous colonic polyps 10/2009    colonoscopy in [facility]    Past Surgical History  Procedure Date  . Cholecystectomy   . Arm surg   . Knee surgery   . Ulcer surg   . Esophagogastroduodenoscopy 01/25/2012    01/25/2012 EGD : Rachael Fee, placed 3 endoclips to oozing DU. of bulb.   . Colonoscopy   . Esophagogastroduodenoscopy 02/02/2012    Procedure: ESOPHAGOGASTRODUODENOSCOPY (EGD);  Surgeon: [name], MD;  Location: [facility] ENDOSCOPY;  Service: Endoscopy;  Laterality: N/A;    History reviewed. No pertinent family  history.  History  Substance Use Topics  . Smoking status: Never Smoker   . Smokeless tobacco: Not on file  . Alcohol Use: No      Review of Systems  Constitutional: Positive for fatigue. Negative for activity change and appetite change.  HENT: Negative for neck stiffness.   Eyes: Negative for pain.  Respiratory: Positive for shortness of breath. Negative for chest tightness.   Cardiovascular: Negative for chest pain and leg swelling.  Gastrointestinal: Positive for blood in stool and hematochezia. Negative for nausea, vomiting, abdominal pain and diarrhea.       Melena  Genitourinary: Negative for flank pain.  Musculoskeletal: Negative for back pain.  Skin: Positive for color change. Negative for rash.  Neurological: Negative for weakness, numbness and headaches.  Psychiatric/Behavioral: Negative for behavioral problems.    Allergies  Niacin and related  Home Medications  No current outpatient prescriptions on file.  BP 148/69  Pulse 76  Temp 98.2 F (36.8 C) (Oral)  Resp 20  Ht 6' (1.829 m)  Wt 268 lb 15.4 oz (122 kg)  BMI 36.48 kg/m2  SpO2 95%  Physical Exam  Nursing note and vitals reviewed. Constitutional: He is oriented to person, place, and time. He appears well-developed and well-nourished.  HENT:  Head: Normocephalic and atraumatic.  Eyes: EOM are normal. Pupils are equal, round, and reactive to light.  Neck: Normal range of motion. Neck  supple.  Cardiovascular: Regular rhythm and normal heart sounds.   No murmur heard.      Tachycardia.  Pulmonary/Chest: Effort normal and breath sounds normal.  Abdominal: Soft. Bowel sounds are normal. He exhibits no distension and no mass. There is no tenderness. There is no rebound and no guarding.  Musculoskeletal: Normal range of motion. He exhibits no edema.  Neurological: He is alert and oriented to person, place, and time. No cranial nerve deficit.  Skin: Skin is warm and dry. There is pallor.  Psychiatric: He  has a normal mood and affect.    ED Course  Procedures (including critical care time)  Labs Reviewed  CBC - Abnormal; Notable for the following:    RBC 1.63 (*)     Hemoglobin 5.3 (*)     HCT 16.5 (*)     MCV 101.2 (*)     RDW 18.2 (*)     All other components within normal limits  COMPREHENSIVE METABOLIC PANEL - Abnormal; Notable for the following:    Potassium 5.2 (*)     CO2 15 (*)     Glucose, Bld 241 (*)     BUN 80 (*)     Creatinine, Ser 3.05 (*)     Albumin 3.2 (*)     Total Bilirubin 0.2 (*)     GFR calc non Af Amer 18 (*)     GFR calc Af Amer 21 (*)     All other components within normal limits  HEMOGLOBIN A1C - Abnormal; Notable for the following:    Hemoglobin A1C 6.0 (*)     Mean Plasma Glucose 126 (*)     All other components within normal limits  URINALYSIS, ROUTINE W REFLEX MICROSCOPIC - Abnormal; Notable for the following:    Hgb urine dipstick TRACE (*)     Protein, ur 30 (*)     All other components within normal limits  COMPREHENSIVE METABOLIC PANEL - Abnormal; Notable for the following:    CO2 18 (*)     Glucose, Bld 146 (*)     BUN 70 (*)     Creatinine, Ser 2.94 (*)     Calcium 8.0 (*)     Total Protein 5.5 (*)     Albumin 2.8 (*)     GFR calc non Af Amer 19 (*)     GFR calc Af Amer 22 (*)     All other components within normal limits  CBC - Abnormal; Notable for the following:    RBC 2.30 (*)     Hemoglobin 7.1 (*)  DELTA CHECK NOTED   HCT 21.6 (*)     RDW 18.9 (*)     All other components within normal limits  PROTIME-INR - Abnormal; Notable for the following:    Prothrombin Time 15.3 (*)     All other components within normal limits  HEMOGLOBIN AND HEMATOCRIT, BLOOD - Abnormal; Notable for the following:    Hemoglobin 7.6 (*)     HCT 22.2 (*)     All other components within normal limits  GLUCOSE, CAPILLARY - Abnormal; Notable for the following:    Glucose-Capillary 65 (*)     All other components within normal limits  GLUCOSE,  CAPILLARY - Abnormal; Notable for the following:    Glucose-Capillary 172 (*)     All other components within normal limits  HEMOGLOBIN AND HEMATOCRIT, BLOOD - Abnormal; Notable for the following:    Hemoglobin 8.6 (*)  HCT 25.1 (*)     All other components within normal limits  HEMOGLOBIN AND HEMATOCRIT, BLOOD - Abnormal; Notable for the following:    Hemoglobin 8.2 (*)     HCT 25.0 (*)     All other components within normal limits  GLUCOSE, CAPILLARY - Abnormal; Notable for the following:    Glucose-Capillary 175 (*)     All other components within normal limits  GLUCOSE, CAPILLARY - Abnormal; Notable for the following:    Glucose-Capillary 165 (*)     All other components within normal limits  GLUCOSE, CAPILLARY - Abnormal; Notable for the following:    Glucose-Capillary 155 (*)     All other components within normal limits  GLUCOSE, CAPILLARY - Abnormal; Notable for the following:    Glucose-Capillary 140 (*)     All other components within normal limits  GLUCOSE, CAPILLARY - Abnormal; Notable for the following:    Glucose-Capillary 182 (*)     All other components within normal limits  PROTIME-INR  TYPE AND SCREEN  PREPARE RBC (CROSSMATCH)  MAGNESIUM  TSH  URINE CULTURE  APTT  SODIUM, URINE, RANDOM  CREATININE, URINE, RANDOM  MRSA PCR SCREENING  GLUCOSE, CAPILLARY  URINE MICROSCOPIC-ADD ON  PREPARE RBC (CROSSMATCH)  GLUCOSE, CAPILLARY  H. PYLORI ANTIBODY, IGG  GLUCOSE, CAPILLARY  GLUCOSE, CAPILLARY  GLUCOSE, CAPILLARY  BASIC METABOLIC PANEL  CBC   No results found.   1. Acute upper GI hemorrhage   2. Acute blood loss anemia   3. Acute posthemorrhagic anemia   4. Duodenal ulcer with hemorrhage       MDM  Patient presents with upper GI bleed. History same. Hemoglobin was found to be 5.3. His blood pressures were maintained, present tachycardic. He'll be admitted to medicine. I discussed the case with Dr. Elenore Paddy from  gastroenterology        Juliet Rude. Rubin Payor, MD 02/04/12 1610

## 2012-02-05 LAB — TYPE AND SCREEN
Unit division: 0
Unit division: 0

## 2012-02-06 NOTE — Care Management Note (Signed)
    Page 1 of 1   02/06/2012     5:25:09 PM   CARE MANAGEMENT NOTE 02/06/2012  Patient:  Chad Carter, Chad Carter   Account Number:  [id number]  Date Initiated:  02/02/2012  Documentation initiated by:  MAYO,HENRIETTA  Subjective/Objective Assessment:   78 yr-old male adm with dx of GI Bleed; lives with spouse, has cane, walker, glucometer,  independent PTA.     Action/Plan:   Anticipated DC Date:  02/04/2012   Anticipated DC Plan:  HOME/SELF CARE      DC Planning Services  CM consult      Choice offered to / List presented to:             Status of service:  Completed, signed off Medicare Important Message given?   (If response is "NO", the following Medicare IM given date fields will be blank) Date Medicare IM given:   Date Additional Medicare IM given:    Discharge Disposition:  HOME/SELF CARE  Per UR Regulation:  Reviewed for med. necessity/level of care/duration of stay  If discussed at Long Length of Stay Meetings, dates discussed:    Comments:  PCP:  Dr. Tarri Fuller with [facility]  02/02/12 1540 [facility] RN MSN CCM Per pt, he has equipment due to previous TKR and is currently active with [employer] community case mgmt program and receives Carter telephone call from Carter nurse each month. Continues to play golf on Carter weekly basis, eats dinner out with his wife several times weekly.  States he knows what he needs to do but doesn't always have the will power to do the right thing.

## 2012-02-08 ENCOUNTER — Other Ambulatory Visit (INDEPENDENT_AMBULATORY_CARE_PROVIDER_SITE_OTHER): Payer: Medicare Other

## 2012-02-08 ENCOUNTER — Telehealth: Payer: Self-pay

## 2012-02-08 DIAGNOSIS — K264 Chronic or unspecified duodenal ulcer with hemorrhage: Secondary | ICD-10-CM

## 2012-02-08 LAB — CBC WITH DIFFERENTIAL/PLATELET
Eosinophils Relative: 3 % (ref 0.0–5.0)
HCT: 28.4 % — ABNORMAL LOW (ref 39.0–52.0)
Hemoglobin: 9.5 g/dL — ABNORMAL LOW (ref 13.0–17.0)
Lymphs Abs: 1.3 10*3/uL (ref 0.7–4.0)
MCV: 97.8 fl (ref 78.0–100.0)
Monocytes Absolute: 0.5 10*3/uL (ref 0.1–1.0)
Monocytes Relative: 8.7 % (ref 3.0–12.0)
Neutro Abs: 3.4 10*3/uL (ref 1.4–7.7)
Platelets: 233 10*3/uL (ref 150.0–400.0)
RDW: 19.1 % — ABNORMAL HIGH (ref 11.5–14.6)
WBC: 5.4 10*3/uL (ref 4.5–10.5)

## 2012-02-08 NOTE — Telephone Encounter (Signed)
Pt came into office concerned because he has had a low grade temp for 3 days. Last night is was 99.2. Pt also wanted to know if he needed to be taking Vit B12. Per Mike Gip PA pt should watch his temp and if it goes higher or he develops other symptoms he should contact his PCP. We will call pt when his lab results come back from today. Pts B12 level was normal and he does not need B12 per Mike Gip PA. Pt aware.

## 2012-02-13 NOTE — Discharge Summary (Signed)
DISCHARGE SUMMARY  Chad Carter  MR#: [id number]  DOB:[birth year]  Date of Admission: 02/01/2012 Date of Discharge: 02/04/12  Attending Physician:Letitia Sabala  Patient's QIO:NGEX Marina Goodell, MD  Consults: [facility]    Discharge Diagnoses: Principal Problem:  *Acute upper GI hemorrhage Active Problems:  HYPOTHYROIDISM  HYPERLIPIDEMIA  HYPERTENSION  GERD  SLEEP APNEA  Acute renal failure  Hyperkalemia  Acute blood loss anemia  Duodenal ulcer with hemorrhage  Diabetes mellitus type II    Discharge Medications: Medication List  As of 02/13/2012 11:41 AM   STOP taking these medications         glyBURIDE 2.5 MG tablet      ranitidine 150 MG tablet         TAKE these medications         ANDROGEL PUMP 20.25 MG/ACT (1.62%) Gel   Generic drug: Testosterone   Place onto the skin.      augmented betamethasone dipropionate 0.05 % cream   Commonly known as: DIPROLENE-AF   Apply topically 2 (two) times daily.      diphenoxylate-atropine 2.5-0.025 MG per tablet   Commonly known as: LOMOTIL   Take 1 tablet by mouth 4 (four) times daily as needed.      enalapril-hydrochlorothiazide 10-25 MG per tablet   Commonly known as: VASERETIC   Take 1 tablet by mouth daily.      gemfibrozil 600 MG tablet   Commonly known as: LOPID   Take 600 mg by mouth 2 (two) times daily before a meal.      insulin glargine 100 UNIT/ML injection   Commonly known as: LANTUS   Inject 40 Units into the skin at bedtime.      levothyroxine 75 MCG tablet   Commonly known as: SYNTHROID, LEVOTHROID   Take 75 mcg by mouth daily.      RELION INSULIN SYR 1CC/30G 30G X 5/16" 1 ML Misc   Generic drug: Insulin Syringe-Needle U-100   by Does not apply route.      testosterone cypionate 200 MG/ML injection   Commonly known as: DEPOTESTOTERONE CYPIONATE   Inject 100 mg into the muscle every 14 (fourteen) days.             Procedures: Dg Chest Portable 1 View  01/24/2012   *RADIOLOGY REPORT*  Clinical Data: Cough and rectal bleeding.  PORTABLE CHEST - 1 VIEW  Comparison: None  Findings: Heart size and mediastinal contours appear normal.  There is no pleural effusion or pulmonary edema.  The lung volumes are low.  No airspace consolidation identified.  IMPRESSION:  1.  Low lung volumes. 2.  No pneumonia.  Original Report Authenticated By: Rosealee Albee, M.D.   Acute Abdominal Series  02/01/2012  *RADIOLOGY REPORT*  Clinical Data: Lower abdominal pain.  ACUTE ABDOMEN SERIES (ABDOMEN 2 VIEW & CHEST 1 VIEW)  Comparison: Chest radiograph on 01/24/2012  Findings: Multiple mildly dilated small bowel loops are seen containing air fluid levels.  There is also mild gaseous distention of the colon which shows air fluid levels.  This is most consistent with an adynamic ileus.  There is no evidence of free intraperitoneal air.  No definite radiopaque calculi identified. Surgical clips are seen within the right upper quadrant from prior cholecystectomy.  Other surgical clips are also seen in the left upper and right lower quadrants.  Low lung volumes are seen with mild bibasilar atelectasis which is not significantly changed since prior study.  No evidence of pulmonary consolidation or pleural effusion.  Heart size is exaggerated by low lung volumes but is stable.  IMPRESSION:  1.  Adynamic ileus. 2.  Low lung volumes with bibasilar atelectasis.  Original Report Authenticated By: Danae Orleans, M.D.     Hospital Course: Chad Carter is a pleasant 78 year old Caucasian gentleman with a history of peptic ulcer disease, prior GI hemorrhage, hypertension, hyperlipidemia, obesity, sleep apnea, diabetes, chronic renal insufficiency, hypothyroidism, B12 deficiency will add an acute GI bleed/anemia on 4 2011 with blood and ulcers noted on EGD. Patient had adenomatous colonic polyps 10/2009 per colonoscopy in [facility].  The patient was admitted on 01/24/2012 through 01/27/2012 with melanotic  stools and hematemesis- had an EGD during that hospitalization which showed an oozing bulb a duodenal ulcer which was treated with 3 endoclips. Patient was transfused a total of 3 units packed red blood cells during the hospitalization - initially placed on a PPI drip which was transitioned to Protonix orally twice daily. On day of discharge patient noted that his stools were brown - hemoglobin at that time was 8.0. Patient was discharged on Zantac twice daily instead of Protonix twice daily. Patient stated that the Protonix prescription had not been called in when [facility].  *Acute upper GI hemorrhage/ Duodenal ulcer with hemorrhage Patient presented back to the ED on 6/19 with a 2 to three-day history of dark tarry stools, generalized weakness, presyncope, dizziness. Repeat Endoscopy on 6/21 by Dr Leone Payor noted and ulcer in the bulb of the duodenum which was smaller when compared to 6/12 - no bleeding or stigmata of recent bleeding was noted.  He was continued on a Protonix drip for a total of 3 days and has been given Nexium samples by GI for discharge.   Acute blood loss anemia *Hemoglobin nadir this admission 5.3 and currently is stable greater than 8  He has received a total of 4 units PRBC during this admission.   DM Due to controlled blood sugars, he is being discharged on a modified regimen of hypoglycemics   Day of Discharge Physical Exam: BP 148/69  Pulse 76  Temp 98.2 F (36.8 C) (Oral)  Resp 20  Ht 6' (1.829 m)  Wt 122 kg (268 lb 15.4 oz)  BMI 36.48 kg/m2  SpO2 95% General appearance: alert, cooperative, appears stated age, no distress and pale  Resp: clear to auscultation bilaterally, RA  Cardio: regular rate and rhythm, S1, S2 normal, no murmur, click, rub or gallop  GI: soft, non-tender; bowel sounds normal; no masses, no organomegaly  Extremities: extremities normal, atraumatic, no cyanosis or edema  Neurologic: Grossly normal   Disposition:  stable   Follow-up Appts: Discharge Orders    Future Appointments: Provider: Department: Dept Phone: Center:   02/28/2012 1:30 PM Hilarie Fredrickson, MD [facility] Office [phone] [facility]     Future Orders Please Complete By Expires   Diet - low sodium heart healthy      Increase activity slowly      Discharge instructions      Comments:   If your blood sugar begins to increase, resume your Glyburide and increase your Lantus steadily. Take the Nexium (samples) as one tab twice a day for 3-4 wks. Speak with Dr Marina Goodell about what to to further.      Time on Discharge: >45 min  Signed: Leticia Coletta 02/13/2012, 11:41 AM

## 2012-02-28 ENCOUNTER — Ambulatory Visit (INDEPENDENT_AMBULATORY_CARE_PROVIDER_SITE_OTHER): Payer: Medicare Other | Admitting: Internal Medicine

## 2012-02-28 ENCOUNTER — Other Ambulatory Visit (INDEPENDENT_AMBULATORY_CARE_PROVIDER_SITE_OTHER): Payer: Medicare Other

## 2012-02-28 ENCOUNTER — Encounter: Payer: Self-pay | Admitting: Internal Medicine

## 2012-02-28 ENCOUNTER — Ambulatory Visit: Payer: Medicare Other | Admitting: Internal Medicine

## 2012-02-28 VITALS — BP 182/88 | HR 80 | Ht 72.0 in | Wt 268.8 lb

## 2012-02-28 DIAGNOSIS — D62 Acute posthemorrhagic anemia: Secondary | ICD-10-CM

## 2012-02-28 DIAGNOSIS — K264 Chronic or unspecified duodenal ulcer with hemorrhage: Secondary | ICD-10-CM

## 2012-02-28 LAB — CBC WITH DIFFERENTIAL/PLATELET
Basophils Relative: 0.5 % (ref 0.0–3.0)
Eosinophils Absolute: 0.1 10*3/uL (ref 0.0–0.7)
HCT: 29.5 % — ABNORMAL LOW (ref 39.0–52.0)
Hemoglobin: 10.1 g/dL — ABNORMAL LOW (ref 13.0–17.0)
Lymphs Abs: 1.5 10*3/uL (ref 0.7–4.0)
MCHC: 34.1 g/dL (ref 30.0–36.0)
MCV: 95.9 fl (ref 78.0–100.0)
Monocytes Absolute: 0.5 10*3/uL (ref 0.1–1.0)
Neutro Abs: 3.2 10*3/uL (ref 1.4–7.7)
Neutrophils Relative %: 59.9 % (ref 43.0–77.0)
RBC: 3.08 Mil/uL — ABNORMAL LOW (ref 4.22–5.81)

## 2012-02-28 MED ORDER — FERROUS SULFATE 325 (65 FE) MG PO TBEC: 325.0000 mg | DELAYED_RELEASE_TABLET | Freq: Three times a day (TID) | ORAL | Status: DC

## 2012-02-28 MED ORDER — OMEPRAZOLE 40 MG PO CPDR: 40.0000 mg | DELAYED_RELEASE_CAPSULE | Freq: Every day | ORAL | Status: DC

## 2012-02-28 NOTE — Progress Notes (Signed)
HISTORY OF PRESENT ILLNESS:  Chad Carter is a 78 y.o. male with multiple medical problems as listed below. He has been followed in this office for peptic ulcer disease with bleeding duodenal ulcer secondary to NSAIDs, B12 deficiency, and diarrhea with urgency. He was last seen in the office November 2011. At that time several recommendations regarding his diarrhea were made. He was to followup in 8 weeks, but failed to do so. He has not been seen since. However, he was hospitalized in June with GI bleeding. This after taking Aleve twice daily couple of times per month. Upper endoscopy performed 01/25/2012 by Dr. Christella Hartigan revealed oozing duodenal ulcer which was treated with Endo Clip therapy. He was treated with PPI. For recurrent melena, repeat endoscopy was performed 02/02/2012 by Dr. Leone Payor. This demonstrated healing ulcer. Previous endoclips had fallen off. PPI therapy continued. Helicobacter pylori antibody was negative. Review of outside laboratories shows a discharge hemoglobin of 8.1. Repeat hemoglobin June 26 was 9.5. He was set up for followup today. He has not had further bleeding. Does complain of fatigue. He is accompanied by his wife. His GI review of systems is otherwise negative. Problems with diarrhea have improved. He was sent home on Nexium. Samples given by our office.  his last colonoscopy was elsewhere in 2011  REVIEW OF SYSTEMS:  All non-GI ROS negative except for fatigue, arthritis,  Past Medical History  Diagnosis Date  . GI bleed 10/2009, 01/2010  . Diabetes mellitus type 2, insulin dependent   . Hypertension   . High cholesterol   . High triglycerides   . Obesity   . Sleep apnea, obstructive     never fitted for CPAP  . Duodenal ulcer with hemorrhage 10/2009, 01/2012  . Irritable bowel syndrome (IBS)     diarrhea predominent  . Anemia due to blood loss 10/2009, 01/2012    2 PRBCs 01/2012.   . Vitamin B 12 deficiency   . Hypothyroidism   . Chronic kidney disease   .  Hx of adenomatous colonic polyps 10/2009    colonoscopy in [facility]    Past Surgical History  Procedure Date  . Cholecystectomy   . Arm surg   . Knee surgery   . Ulcer surg   . Esophagogastroduodenoscopy 01/25/2012    01/25/2012 EGD : Rachael Fee, placed 3 endoclips to oozing DU. of bulb.   . Colonoscopy   . Esophagogastroduodenoscopy 02/02/2012    Procedure: ESOPHAGOGASTRODUODENOSCOPY (EGD);  Surgeon: [name], MD;  Location: [facility] ENDOSCOPY;  Service: Endoscopy;  Laterality: N/A;    Social History Chad Carter  reports that he has never smoked. He has never used smokeless tobacco. He reports that he does not drink alcohol or use illicit drugs.  family history includes Diabetes in his maternal grandfather, maternal grandmother, mother, paternal grandfather, and paternal grandmother; Heart disease in his father; and Uterine cancer in his mother.  Allergies  Allergen Reactions  . Niacin And Related Rash       PHYSICAL EXAMINATION: Vital signs: BP 182/88  Pulse 80  Ht 6' (1.829 m)  Wt 268 lb 12.8 oz (121.927 kg)  BMI 36.46 kg/m2 General: Well-developed, well-nourished, no acute distress HEENT: Sclerae are anicteric, conjunctiva pink. Oral mucosa intact Lungs: Clear Heart: Regular Abdomen: soft, obese,nontender, nondistended, no obvious ascites, no peritoneal signs, normal bowel sounds. No organomegaly. Extremities: No edema Psychiatric: alert and oriented x3. Cooperative     ASSESSMENT:  #1. Recurrent bleeding duodenal ulcer secondary to NSAIDs. Status  post endoscopic hemostatic therapy. On PPI #2. History of colon polyps 2011 #3. Anemia secondary to blood loss. Ongoing fatigue   PLAN:  #1. Avoid NSAIDs #2. Prescribe omeprazole 40 mg daily #3. Obtain CBC today #4. Iron sulfate 65 mg daily #5. Surveillance colonoscopy around 2016 if medically fit

## 2012-02-28 NOTE — Patient Instructions (Addendum)
Your physician has requested that you go to the basement for lab work before leaving today   We have sent the following medications to your pharmacy for you to pick up at your convenience: Omeprazole  Please start taking Iron Sulfate 65mg  over the counter

## 2012-03-16 ENCOUNTER — Telehealth: Payer: Self-pay

## 2012-03-16 NOTE — Telephone Encounter (Signed)
Clarified with pt that his PCP was Tarri Fuller.  April is changing this in the system

## 2012-07-27 ENCOUNTER — Other Ambulatory Visit: Payer: Self-pay | Admitting: Internal Medicine

## 2012-07-27 ENCOUNTER — Telehealth: Payer: Self-pay | Admitting: Internal Medicine

## 2012-07-27 DIAGNOSIS — R197 Diarrhea, unspecified: Secondary | ICD-10-CM

## 2012-07-27 MED ORDER — METRONIDAZOLE 250 MG PO TABS: 250.0000 mg | ORAL_TABLET | Freq: Four times a day (QID) | ORAL | Status: DC

## 2012-07-27 NOTE — Telephone Encounter (Signed)
Pt states he had knee surgery 07/18/12. Now pt reports that he is having loose stools for about the last 3 weeks. Pt took Imodium but is it not helping. Pt state a couple of days ago he went to the bathroom about 14 times. Do you want the pt to come and give a stool specimen and possibly have OV with midlevel Monday? Please advise.

## 2012-07-27 NOTE — Telephone Encounter (Signed)
Have him submit a stool study for C. difficile by PCR. Empirically start metronidazole 250 mg 4 times a day x7 days. Set up an appointment with a mid-level first part of next week

## 2012-07-27 NOTE — Telephone Encounter (Signed)
Pt aware, states he will come to lab on Monday. Pt scheduled to see [name] PA 08/01/12@10am . Rx sent to pharmacy and pt aware.

## 2012-07-30 ENCOUNTER — Encounter: Payer: Self-pay | Admitting: *Deleted

## 2012-07-30 ENCOUNTER — Other Ambulatory Visit: Payer: Medicare Other

## 2012-07-30 DIAGNOSIS — R197 Diarrhea, unspecified: Secondary | ICD-10-CM

## 2012-07-31 ENCOUNTER — Ambulatory Visit (INDEPENDENT_AMBULATORY_CARE_PROVIDER_SITE_OTHER): Payer: Medicare Other | Admitting: Physician Assistant

## 2012-07-31 ENCOUNTER — Other Ambulatory Visit (INDEPENDENT_AMBULATORY_CARE_PROVIDER_SITE_OTHER): Payer: Medicare Other

## 2012-07-31 ENCOUNTER — Encounter: Payer: Self-pay | Admitting: Physician Assistant

## 2012-07-31 VITALS — BP 140/70 | HR 66 | Ht 72.0 in | Wt 278.4 lb

## 2012-07-31 DIAGNOSIS — R197 Diarrhea, unspecified: Secondary | ICD-10-CM

## 2012-07-31 DIAGNOSIS — K589 Irritable bowel syndrome without diarrhea: Secondary | ICD-10-CM

## 2012-07-31 DIAGNOSIS — D649 Anemia, unspecified: Secondary | ICD-10-CM

## 2012-07-31 DIAGNOSIS — Z8719 Personal history of other diseases of the digestive system: Secondary | ICD-10-CM

## 2012-07-31 LAB — CBC WITH DIFFERENTIAL/PLATELET
Eosinophils Relative: 3.3 % (ref 0.0–5.0)
HCT: 26.2 % — ABNORMAL LOW (ref 39.0–52.0)
Hemoglobin: 9 g/dL — ABNORMAL LOW (ref 13.0–17.0)
Lymphs Abs: 1.1 10*3/uL (ref 0.7–4.0)
MCV: 95.4 fl (ref 78.0–100.0)
Monocytes Absolute: 0.5 10*3/uL (ref 0.1–1.0)
Neutro Abs: 4.1 10*3/uL (ref 1.4–7.7)
Platelets: 267 10*3/uL (ref 150.0–400.0)
RDW: 14.7 % — ABNORMAL HIGH (ref 11.5–14.6)

## 2012-07-31 LAB — BASIC METABOLIC PANEL
CO2: 25 mEq/L (ref 19–32)
Calcium: 7.8 mg/dL — ABNORMAL LOW (ref 8.4–10.5)
Creatinine, Ser: 2.5 mg/dL — ABNORMAL HIGH (ref 0.4–1.5)
GFR: 27.27 mL/min — ABNORMAL LOW (ref >60.00)
Glucose, Bld: 202 mg/dL — ABNORMAL HIGH (ref 70–99)
Sodium: 139 mEq/L (ref 135–145)

## 2012-07-31 MED ORDER — DIPHENOXYLATE-ATROPINE 2.5-0.025 MG PO TABS: 1.0000 | ORAL_TABLET | Freq: Four times a day (QID) | ORAL | Status: DC | PRN

## 2012-07-31 MED ORDER — SACCHAROMYCES BOULARDII 250 MG PO CAPS: 250.0000 mg | ORAL_CAPSULE | Freq: Two times a day (BID) | ORAL | Status: DC

## 2012-07-31 MED ORDER — METRONIDAZOLE 250 MG PO TABS: 250.0000 mg | ORAL_TABLET | Freq: Four times a day (QID) | ORAL | Status: AC

## 2012-07-31 NOTE — Patient Instructions (Addendum)
Please go to the basement level to have your labs drawn.    We [facility] to [facility] [address] for Flagyl 14 days total, Florastor and Lomotil for diarrhea. Do not start the Lomotil tablets until you are done taking the Flagyl ( Metronidazole) medication.  We made you a follow up appointment with Dr. Yancey Flemings for 08-29-2012 at 10 Am.

## 2012-07-31 NOTE — Progress Notes (Signed)
Subjective:    Patient ID: Chad Carter, male    DOB: 1934/01/11, 78 y.o.   MRN: [id number]  HPI Byran is a very nice 78 year old male known to Dr. Marina Goodell who has multiple medical problems. He was hospitalized in June of 2013 with an upper GI bleed secondary to a duodenal ulcer. He last had endoscopy on 02/02/2012 and this showed him to have a healing duodenal ulcer. H. pylori was negative He also has history of fairly chronic diarrhea, last colonoscopy was done in 2011 by a doctor showed moderate diverticulosis and small internal hemorrhoids, he did not have any biopsies done . Chad Carter just had a right knee replacement done 06/18/2012 in [place] and was hospitalized at [facility] for about a week and then went to a rehabilitation after that. He says that he required blood transfusions while he was in the hospital and still feels weak. He had been on a blood thinner which has since been discontinued. He comes in today because he states that he has been having problems with diarrhea over the past couple of years but generally just with 2-3 bowel movements per day of loose stool. He had been given a prescription in the past for Lomotil and says that that was helpful but he has not had any recently. Now over the past 2-2-1/2 weeks he has been having much worse diarrhea with up to 10-14 bowel movements per day at its worst. Over the past 3 or 4 days he says he is having 6-10 watery bowel movements per day they are malodorous and nonbloody. He has increased abdominal gas no cramping or pain no nausea vomiting and no fever or chills. He does feel somewhat bloated and distended in his abdomen and says that his appetite has been decreased. He was called in a trial of Flagyl a couple of days ago but has not started this as yet - he did bring in his stool for C. difficile yesterday which is still pending. He is not aware of being on any recent antibiotics other than perioperatively    Review of  Systems  Constitutional: Positive for activity change and appetite change.  HENT: Positive for trouble swallowing.   Eyes: Negative.   Respiratory: Negative.   Cardiovascular: Positive for leg swelling.  Gastrointestinal: Positive for diarrhea and abdominal distention.  Genitourinary: Negative.   Musculoskeletal: Positive for joint swelling and arthralgias.  Neurological: Positive for weakness.  Hematological: Negative.   Psychiatric/Behavioral: Negative.    Outpatient Prescriptions Prior to Visit  Medication Sig Dispense Refill  . ferrous sulfate 325 (65 FE) MG EC tablet Take 1 tablet (325 mg total) by mouth 3 (three) times daily with meals.  30 tablet  11  . gemfibrozil (LOPID) 600 MG tablet Take 600 mg by mouth 2 (two) times daily before a meal.      . glyBURIDE (DIABETA) 2.5 MG tablet Take 2.5 mg by mouth 2 (two) times daily with a meal.      . insulin glargine (LANTUS OPTICLIK) 100 UNIT/ML injection Inject 40 Units into the skin at bedtime.  10 mL    . Insulin Syringe-Needle U-100 (RELION INSULIN SYR 1CC/30G) 30G X 5/16" 1 ML MISC by Does not apply route.      Marland Kitchen levothyroxine (SYNTHROID, LEVOTHROID) 75 MCG tablet Take 75 mcg by mouth daily.      . Multiple Vitamin (MULTIVITAMIN) tablet Take 1 tablet by mouth daily.      . Testosterone (ANDROGEL PUMP) 20.25 MG/ACT (1.62%)  GEL Place onto the skin.      . [DISCONTINUED] omeprazole (PRILOSEC) 40 MG capsule Take 1 capsule (40 mg total) by mouth daily.  30 capsule  11  . [DISCONTINUED] enalapril-hydrochlorothiazide (VASERETIC) 10-25 MG per tablet Take 1 tablet by mouth daily.      . [DISCONTINUED] metroNIDAZOLE (FLAGYL) 250 MG tablet Take 1 tablet (250 mg total) by mouth 4 (four) times daily.  28 tablet  0   Last reviewed on 07/31/2012 11:47 AM by Sammuel Cooper, PA  Allergies  Allergen Reactions  . Avandia (Rosiglitazone)     Made skin red  . Niacin And Related Rash   Patient Active Problem List  Diagnosis  . HYPOTHYROIDISM  .  HYPERLIPIDEMIA  . ACUTE POSTHEMORRHAGIC ANEMIA  . HYPERTENSION  . GERD  . OSTEOARTHRITIS  . SLEEP APNEA  . PERSONAL HX COLONIC POLYPS  . PERNICIOUS ANEMIA  . Acute renal failure  . Hyperkalemia  . Acute upper GI hemorrhage  . Acute blood loss anemia  . Tachycardia  . Duodenal ulcer with hemorrhage  . Hyperglycemia  . Diabetes mellitus type II       History  Substance Use Topics  . Smoking status: Never Smoker   . Smokeless tobacco: Never Used  . Alcohol Use: No   Family History  Problem Relation Age of Onset  . Uterine cancer Mother   . Diabetes Mother   . Heart disease Father   . Diabetes Maternal Grandmother   . Diabetes Maternal Grandfather   . Diabetes Paternal Grandmother   . Diabetes Paternal Grandfather   . Colon cancer      unknown if in family    Objective:   Physical Exam O. well-developed elderly white male in no acute distress, using a rolling walker. Accompanied by his wife blood pressure 140/70 pulse 66 height 6 foot weight 278. HEENT; nontraumatic normocephalic EOMI PERRLA sclera anicteric,Neck; Supple no JVD, Cardiovascular; regular rate and rhythm with S1-S2 no murmur or gallop, Pulmonary; clear bilaterally, Abdomen; large, basically nontender bowel sounds are somewhat hyperactive no palpable mass or hepatosplenomegaly, Rectal; exam not done, Extremities;1+ edema bilateral lower extremities, he is status post recent right knee replacement, Psych; mood and affect normal and appropriate        Assessment & Plan:  #25  78 year old male with 2 week history of significant diarrhea-superimposed on chronic mild diarrhea Will need to rule out C. difficile colitis, given recent hospitalization and rehabilitation stay Patient may have a chronic microscopic colitis versus IBS  #2 history of duodenal ulcer with GI bleed June 2013 #3 recent right knee replacement #4 chronic GERD #5 chronic kidney disease #6 insulin-dependent diabetes mellitus  #7 sleep  apnea #8 history of adenomatous colon polyps-last colonoscopy March 2011 no polyps  Plan; CBC with differential and BMET today Await stool for C. difficile PCR Start Flagyl 250 mg by mouth 4 times daily x14 days [employer] floor store one by mouth twice daily x2 weeks We will refill his Lomotil 1-4 tablets daily as needed for his chronic diarrhea, but have asked him not to take this over the next couple of weeks as antidiarrheals are discouraged in setting of C. difficile colitis Bland low-residue diet and push fluids Followup office appointment with Dr. Marina Goodell or myself in 2-3 weeks

## 2012-07-31 NOTE — Progress Notes (Signed)
Agree with initial assessment and plans 

## 2012-08-29 ENCOUNTER — Encounter: Payer: Self-pay | Admitting: Internal Medicine

## 2012-08-29 ENCOUNTER — Ambulatory Visit (INDEPENDENT_AMBULATORY_CARE_PROVIDER_SITE_OTHER): Payer: Medicare Other | Admitting: Internal Medicine

## 2012-08-29 VITALS — BP 142/70 | HR 80 | Ht 70.0 in | Wt 268.0 lb

## 2012-08-29 DIAGNOSIS — K219 Gastro-esophageal reflux disease without esophagitis: Secondary | ICD-10-CM

## 2012-08-29 DIAGNOSIS — K264 Chronic or unspecified duodenal ulcer with hemorrhage: Secondary | ICD-10-CM

## 2012-08-29 DIAGNOSIS — R197 Diarrhea, unspecified: Secondary | ICD-10-CM

## 2012-08-29 NOTE — Patient Instructions (Addendum)
Please follow up with Dr. Perry as needed 

## 2012-08-29 NOTE — Progress Notes (Signed)
HISTORY OF PRESENT ILLNESS:  Chad Carter is a 79 y.o. male with multiple medical problems who has been followed in this office for duodenal ulcer disease with bleeding, GERD, chronic diarrhea. He was seen by the physician assistant 07/31/2012 for worsening of his chronic diarrhea. See that dictation for details. Blood work and stool studies, including Clostridium difficile, were negative. He was empirically treated with a course of metronidazole and probiotic Florastor. He presents today for routine followup, as instructed, with his wife. He reports resolution of acutely exacerbated diarrhea. Now back to his baseline of 3-4 soft, but per day. No urgency or incontinence. He has been having other issues since recovering from his knee surgery, including increase fluid accumulation due to chronic kidney disease for which she has recently received IV diuretics (according to him). Aside from chronic mild intermittent pill dysphagia, no other GI complaints. He did complete Florastor 2 days ago.  REVIEW OF SYSTEMS:  All non-GI ROS negative except for arthritis, cough, hearing problems, shortness of breath, ankle edema  Past Medical History  Diagnosis Date  . GI bleed 10/2009, 01/2010  . Diabetes mellitus type 2, insulin dependent   . Hypertension   . High cholesterol   . High triglycerides   . Obesity   . Sleep apnea, obstructive     never fitted for CPAP  . Duodenal ulcer with hemorrhage 10/2009, 01/2012  . Irritable bowel syndrome (IBS)     diarrhea predominent  . Anemia due to blood loss 10/2009, 01/2012    2 PRBCs 01/2012.   . Vitamin B 12 deficiency   . Hypothyroidism   . Chronic kidney disease   . Hx of adenomatous colonic polyps 10/2009    colonoscopy in [facility]  . Diverticulosis 10/2009  . Internal hemorrhoids 10/2009    Past Surgical History  Procedure Date  . Cholecystectomy   . Arm surg   . Total knee arthroplasty     bilateral  . Ulcer surg   . Esophagogastroduodenoscopy  01/25/2012    01/25/2012 EGD : Rachael Fee, placed 3 endoclips to oozing DU. of bulb.   . Colonoscopy   . Esophagogastroduodenoscopy 02/02/2012    Procedure: ESOPHAGOGASTRODUODENOSCOPY (EGD);  Surgeon: [name], MD;  Location: [facility] ENDOSCOPY;  Service: Endoscopy;  Laterality: N/A;    Social History KIMBERLEY DASTRUP  reports that he has never smoked. He has never used smokeless tobacco. He reports that he does not drink alcohol or use illicit drugs.  family history includes Colon cancer in an unspecified family member; Diabetes in his maternal grandfather, maternal grandmother, mother, paternal grandfather, and paternal grandmother; Heart disease in his father; and Uterine cancer in his mother.  Allergies  Allergen Reactions  . Avandia (Rosiglitazone)     Made skin red  . Niacin And Related Rash       PHYSICAL EXAMINATION: Vital signs: BP 142/70  Pulse 80  Ht 5\' 10"  (1.778 m)  Wt 268 lb (121.564 kg)  BMI 38.45 kg/m2 General: Well-developed, well-nourished, no acute distress HEENT: Sclerae are anicteric, conjunctiva pink. Oral mucosa intact Lungs: Clear Heart: Regular Abdomen: soft, obese, nontender, nondistended, no obvious ascites, no peritoneal signs, normal bowel sounds. No organomegaly. Extremities: No edema Psychiatric: alert and oriented x3. Cooperative     ASSESSMENT:  #1. Acute on chronic diarrhea. Improved after empiric course of metronidazole and probiotic. Negative testing for Clostridium difficile #2. History of bleeding duodenal ulcer. On chronic omeprazole #3. GERD. On chronic omeprazole #4. Multiple medical problems  PLAN:  #1. Continue omeprazole #2. Antidiarrheals as needed (currently not needing) #3. May use 2-3 week course of probiotics on demand helpful. Otherwise, GI followup as needed

## 2013-02-18 ENCOUNTER — Ambulatory Visit (INDEPENDENT_AMBULATORY_CARE_PROVIDER_SITE_OTHER): Payer: Medicare Other | Admitting: Nurse Practitioner

## 2013-02-18 ENCOUNTER — Encounter: Payer: Self-pay | Admitting: Nurse Practitioner

## 2013-02-18 VITALS — BP 150/80 | HR 66 | Ht 72.0 in | Wt 279.2 lb

## 2013-02-18 DIAGNOSIS — K625 Hemorrhage of anus and rectum: Secondary | ICD-10-CM | POA: Insufficient documentation

## 2013-02-18 DIAGNOSIS — K648 Other hemorrhoids: Secondary | ICD-10-CM

## 2013-02-18 MED ORDER — DIPHENOXYLATE-ATROPINE 2.5-0.025 MG PO TABS: ORAL_TABLET | ORAL | Status: DC

## 2013-02-18 MED ORDER — HYDROCORTISONE ACETATE 25 MG RE SUPP: 25.0000 mg | Freq: Two times a day (BID) | RECTAL | Status: DC

## 2013-02-18 NOTE — Progress Notes (Signed)
Agree with initial assessment and plans 

## 2013-02-18 NOTE — Progress Notes (Signed)
History of Present Illness:   Patient is a 79 year old male with multiple medical problems known to Dr. Marina Goodell for a history of an upper GI bleed / duodenal ulcer June 2013. He also has a history of chronic diarrhea and adenomatous colon polyps. Patient was last seen December 2013 when he presented for evaluation of acute on chronic diarrhea. C. difficile was negative. Patient was treated empirically with Flagyl and Lomotil.  Patient walked into the office today with complaints of rectal bleeding., he was worked in for an appointment. Bleeding painless, started this am after straining with BM. He has had a total of two episodes of bleeding today, both consisting of a small amount of bright red blood. Patient saw Nephrologist this am, CBC is pending and we called for results.   Patient here with wife who is concerned about patient's chronic diarrhea. Stools vary from being unformed to loose and several times a day.  Patient doesn't take any anti-diarrhea agents. His last colonoscopy was done by Dr. Norma Fredrickson March 2011 for evaluation of abdominal pain and personal history of colon polyps.  Extent of the exam was to the cecum, findings included diverticulosis and internal hemorrhoids.   Current Medications, Allergies, Past Medical History, Past Surgical History, Family History and Social History were reviewed in [employer] record.  Physical Exam: General: pleasant, obese white male in no acute distress Head: Normocephalic and atraumatic Eyes:  sclerae anicteric, conjunctiva pink  Ears: Normal auditory acuity Lungs: Clear throughout to auscultation Heart: Regular rate and rhythm Abdomen: Soft, obese, mild right mid abdominal tenderness. No obvious masses but suboptimal exam secondary to body habitus. Normal bowel sounds Rectal: Large area of purplish discoloration of skin involving left perianal area. Area soft, non fluctuant, non tender. No evidence of any fistulas or other  external lesions. Posterior midline tenderness on DRE where some thickening was noted just inside anal anal (?hemorrhoid, ?fissure). Anoscopic exam suboptimal secondary to discomfort but friable hemorrhoids seen. Musculoskeletal: Symmetrical with no gross deformities  Extremities: trace BLE edema  Neurological: Alert oriented x 4, grossly nonfocal Psychological:  Alert and cooperative. Normal mood and affect  Assessment and Recommendations: 1. Low volume painless rectal bleeding., likely from internal hemorrhoid vrs fissure following episode of straining with defecation this am.  Will treat with Anusol HC supp for one week. Avoid straining. Follow up in 3-4 weeks. In the interim patient will call office ASAP if bleeding increases at which time we will need to consider other etiologies. Of note: received CBC drawn this am by Nephrologist. Hgb is 9.8, it was 9.0 six months ago.   2. Large area of purple discoloration of skin involving left perianal area. Patient feels this is related to skin breakdown from sleeping in a recliner because of sleep apnea. Nothing on exam at this point to suggest abscess or other pathology (area soft, non-tender, non-fluctuant, without obvious fistula or other lesion). WBC normal this am.   3. Chronic diarrhea. Currently untreated. Trial of Lomotil 2-3 times daily as needed. No weight loss by our records. Weight actually up several pounds over the last year. Random biopsies not done at time of 2011 colonoscopy by Dr. Norma Fredrickson (diarrhea not listed as indication).   4. ? History of colon polyps ( according to 2011 colonoscopy indications). Details unknown but no polyps on 2011 colonoscopy.   5. Chronic anemia, possibly secondary to CKD.   6. Multiple medical problems.

## 2013-02-18 NOTE — Patient Instructions (Addendum)
Do not strain with bowel movements. We sent a prescription for Anusol HC Suppositories to [facility] [address], [place].  We have given you a typed prescription for Lomotil signed by Willette Cluster ACNP to take to the pharmacy.  We have made you a follow up appointment with Dr. Yancey Flemings for 03-18-2013 at 1:30 PM.  If increased bleeding, call us back right away and we can make you an appointment to be seen.

## 2013-02-25 ENCOUNTER — Encounter: Payer: Self-pay | Admitting: Internal Medicine

## 2013-03-18 ENCOUNTER — Ambulatory Visit (INDEPENDENT_AMBULATORY_CARE_PROVIDER_SITE_OTHER): Payer: Medicare Other | Admitting: Internal Medicine

## 2013-03-18 ENCOUNTER — Other Ambulatory Visit (INDEPENDENT_AMBULATORY_CARE_PROVIDER_SITE_OTHER): Payer: Medicare Other

## 2013-03-18 ENCOUNTER — Encounter: Payer: Self-pay | Admitting: Internal Medicine

## 2013-03-18 VITALS — BP 120/60 | HR 72 | Ht 72.0 in | Wt 274.0 lb

## 2013-03-18 DIAGNOSIS — Z8719 Personal history of other diseases of the digestive system: Secondary | ICD-10-CM

## 2013-03-18 DIAGNOSIS — R197 Diarrhea, unspecified: Secondary | ICD-10-CM

## 2013-03-18 DIAGNOSIS — K219 Gastro-esophageal reflux disease without esophagitis: Secondary | ICD-10-CM

## 2013-03-18 DIAGNOSIS — K589 Irritable bowel syndrome without diarrhea: Secondary | ICD-10-CM

## 2013-03-18 DIAGNOSIS — K648 Other hemorrhoids: Secondary | ICD-10-CM

## 2013-03-18 LAB — IGA: IgA: 121 mg/dL (ref 68–378)

## 2013-03-18 MED ORDER — METRONIDAZOLE 250 MG PO TABS: 250.0000 mg | ORAL_TABLET | Freq: Three times a day (TID) | ORAL | Status: DC

## 2013-03-18 MED ORDER — DIPHENOXYLATE-ATROPINE 2.5-0.025 MG PO TABS: ORAL_TABLET | ORAL | Status: DC

## 2013-03-18 NOTE — Progress Notes (Signed)
HISTORY OF PRESENT ILLNESS:  Chad Carter is a 79 y.o. male with insulin requiring diabetes mellitus, hypertension, hyperlipidemia, morbid obesity, sleep apnea, and hypothyroidism. He is followed in this office for a history of bleeding duodenal ulcer in June 2013 and chronic diarrhea felt to be IBS related. He presents today for followup regarding chronic diarrhea. He was seen by our extender 02/18/2013. At that time complaints for diarrhea and rectal bleeding. He was found to have hemorrhoids. He was treated with Lomotil and medicated suppositories. Followup at this time arranged. The bleeding has resolved with suppository therapy. He has been taking Lomotil twice daily with incomplete improvement diarrhea. Actually, what he describes infrequent stools, not necessarily diarrhea. He is accompanied by his wife. His last colonoscopy (elsewhere) for a history of polyps was performed in March 2011. The examination was complete to the cecum with good preparation. No polyps found. It was recommended that he have followup in 5 years for prior history of "polyps". He reports progressive kidney disease. The majority of his healthcare is [facility] in [facility]. He is accompanied by his wife   REVIEW OF SYSTEMS:  All non-GI ROS negative except for exertional shortness of breath, fatigue, arthritis, ankle edema, decreased hearing, increased urination, knee pain.  Past Medical History  Diagnosis Date  . Diabetes mellitus type 2, insulin dependent   . Hypertension   . High cholesterol   . High triglycerides   . Obesity   . Sleep apnea, obstructive     never fitted for CPAP  . Duodenal ulcer with hemorrhage 10/2009, 01/2012  . Irritable bowel syndrome (IBS)     diarrhea predominent  . Anemia due to blood loss 10/2009, 01/2012    2 PRBCs 01/2012.   . Vitamin B 12 deficiency   . Hypothyroidism   . Chronic kidney disease   . Hx of adenomatous colonic polyps 10/2009    colonoscopy in [facility]  .  Diverticulosis 10/2009  . Internal hemorrhoids 10/2009    Past Surgical History  Procedure Laterality Date  . Cholecystectomy    . Arm surg    . Total knee arthroplasty      bilateral  . Ulcer surg    . Esophagogastroduodenoscopy  01/25/2012    01/25/2012 EGD : Rachael Fee, placed 3 endoclips to oozing DU. of bulb.   . Colonoscopy    . Esophagogastroduodenoscopy  02/02/2012    Procedure: ESOPHAGOGASTRODUODENOSCOPY (EGD);  Surgeon: [name], MD;  Location[facility] ENDOSCOPY;  Service: Endoscopy;  Laterality: N/A;    Social History Chad Carter  reports that he has never smoked. He has never used smokeless tobacco. He reports that he does not drink alcohol or use illicit drugs.  family history includes Colon cancer in an unspecified family member; Diabetes in his maternal grandfather, maternal grandmother, mother, paternal grandfather, and paternal grandmother; Heart disease in his father; and Uterine cancer in his mother.  Allergies  Allergen Reactions  . Avandia (Rosiglitazone)     Made skin red  . Niacin And Related Rash       PHYSICAL EXAMINATION: Vital signs: BP 120/60  Pulse 72  Ht 6' (1.829 m)  Wt 274 lb (124.286 kg)  BMI 37.15 kg/m2 General: Well-developed, obese,  well-nourished, no acute distress. Unhealthy appearing  HEENT: Sclerae are anicteric, conjunctiva pink. Oral mucosa intact Lungs: Clear Heart: Regular Abdomen: soft, nontender, obese, nondistended, no obvious ascites, no peritoneal signs, normal bowel sounds. No organomegaly. Extremities:  trace edema  Psychiatric: alert  and oriented x3. Cooperative   ASSESSMENT:  #1. Frequent bowel movements/diarrhea. Ongoing #2. History of bleeding duodenal ulcer #3. GERD. On Prilosec #4. Remote history of colon polyp (no details). Last colonoscopy 2011 without polyps. Given his age, and comorbidities, I would not recommend future surveillance    PLAN:  #1. Prescribe metronidazole 250 mg by mouth 3  times a day x10 days. He may have bacterial overgrowth #2. Check tissue transglutaminase antibody and serum IgA as a screening test for celiac disease #3. Increase Lomotil to one 3 times a day. Hold For constipation. Prescribed #4. Routine office followup in 4 weeks. Okay to cancel a few days ahead of time if feeling better #5. Told to see his PCP ASAP about shortness of breath issues

## 2013-03-18 NOTE — Patient Instructions (Addendum)
Your physician has requested that you go to the basement for the following lab work before leaving today:  TTG, IGA  We have sent the following medications to your pharmacy for you to pick up at your convenience:  Lomotil, Metronidazole  Please follow up with Dr. Marina Goodell in 4 weeks

## 2013-03-19 LAB — TISSUE TRANSGLUTAMINASE, IGA: Tissue Transglutaminase Ab, IgA: 2.5 U/mL (ref <20)

## 2013-04-19 ENCOUNTER — Encounter: Payer: Self-pay | Admitting: Internal Medicine

## 2013-04-19 ENCOUNTER — Ambulatory Visit (INDEPENDENT_AMBULATORY_CARE_PROVIDER_SITE_OTHER): Payer: Medicare Other | Admitting: Internal Medicine

## 2013-04-19 VITALS — BP 128/74 | HR 70 | Ht 72.0 in | Wt 272.0 lb

## 2013-04-19 DIAGNOSIS — K589 Irritable bowel syndrome without diarrhea: Secondary | ICD-10-CM

## 2013-04-19 DIAGNOSIS — K264 Chronic or unspecified duodenal ulcer with hemorrhage: Secondary | ICD-10-CM

## 2013-04-19 DIAGNOSIS — K219 Gastro-esophageal reflux disease without esophagitis: Secondary | ICD-10-CM

## 2013-04-19 DIAGNOSIS — R197 Diarrhea, unspecified: Secondary | ICD-10-CM

## 2013-04-19 NOTE — Patient Instructions (Addendum)
You have been given some information on IBS.  Please follow up with Dr. Marina Goodell as needed

## 2013-04-19 NOTE — Progress Notes (Signed)
HISTORY OF PRESENT ILLNESS:  Chad Carter is a 79 y.o. male with multiple significant medical problems as listed below he has been followed in this office for a history of bleeding duodenal ulcer in June of 2013, and chronic diarrhea (10-15 years) felt to be secondary to IBS. Recent office evaluations have been centered around diarrhea. He has been on multiple empiric agents with variable results. As always, he is accompanied by his wife. He was last seen 03/18/2013. He was empirically prescribe metronidazole for possible bacterial overgrowth. We did check him for celiac disease, which was negative. Recommend increasing Lomotil to 3 times daily. Patient reports that he has been doing better over the past week. No bowel movement yesterday. One bowel movement today. His wife seems to think that increasing Lomotil worsen his diarrhea. Really no new complaints. He has chronic renal insufficiency and is being considered for future dialysis. Also being worked up for significant problems with shortness of breath. From a GI standpoint, no new problems or alarm issues. It was recommended to him to try lactose free. Previous colonoscopy in [facility], March 2011, was unremarkable. He has a new PCP. He complains of some reflux symptoms despite Prilosec 40 mg twice a day. No dysphagia. No weight loss.  REVIEW OF SYSTEMS:  All non-GI ROS negative except for fatigue, hearing problems, as ankle swelling, increased urination, skin rash  Past Medical History  Diagnosis Date  . Diabetes mellitus type 2, insulin dependent   . Hypertension   . High cholesterol   . High triglycerides   . Obesity   . Sleep apnea, obstructive     never fitted for CPAP  . Duodenal ulcer with hemorrhage 10/2009, 01/2012  . Irritable bowel syndrome (IBS)     diarrhea predominent  . Anemia due to blood loss 10/2009, 01/2012    2 PRBCs 01/2012.   . Vitamin B 12 deficiency   . Hypothyroidism   . Chronic kidney disease   . Hx of  adenomatous colonic polyps 10/2009    colonoscopy in [facility]  . Diverticulosis 10/2009  . Internal hemorrhoids 10/2009    Past Surgical History  Procedure Laterality Date  . Cholecystectomy    . Arm surg    . Total knee arthroplasty      bilateral  . Ulcer surg    . Esophagogastroduodenoscopy  01/25/2012    01/25/2012 EGD : Rachael Fee, placed 3 endoclips to oozing DU. of bulb.   . Colonoscopy    . Esophagogastroduodenoscopy  02/02/2012    Procedure: ESOPHAGOGASTRODUODENOSCOPY (EGD);  Surgeon: [name], MD;  Location: [facility] ENDOSCOPY;  Service: Endoscopy;  Laterality: N/A;    Social History Chad Carter  reports that he has never smoked. He has never used smokeless tobacco. He reports that he does not drink alcohol or use illicit drugs.  family history includes Colon cancer in an other family member; Diabetes in his maternal grandfather, maternal grandmother, mother, paternal grandfather, and paternal grandmother; Heart disease in his father; Uterine cancer in his mother.  Allergies  Allergen Reactions  . Avandia [Rosiglitazone]     Made skin red  . Niacin And Related Rash       PHYSICAL EXAMINATION: Vital signs: BP 128/74  Pulse 70  Ht 6' (1.829 m)  Wt 272 lb (123.378 kg)  BMI 36.88 kg/m2 General: Obese. Well-developed, well-nourished, no acute distress HEENT: Sclerae are anicteric, conjunctiva pink. Oral mucosa intact Lungs: Clear Heart: Regular Abdomen: soft, nontender, nondistended, no obvious  ascites, no peritoneal signs, normal bowel sounds. No organomegaly. Extremities: No edema Psychiatric: alert and oriented x3. Cooperative   ASSESSMENT:  #1. Diarrhea predominant IBS. Ongoing. May be a bit better, query response to metronidazole #2. GERD. Some breakthrough with twice a day PPI #3. History of duodenal ulcer 2013. No recurrence. Maintained on PPI #4. Last colonoscopy 2011, unremarkable #5. MULTIPLE significant medical problems.  PLAN:  #1.  Discussion on IBS. No lot more to offer at this point. Treatment symptomatic. #2. Continue PPI for GERD and history of complicated ulcer disease #3. Reflux precautions. It would helped immensely if he could lose significant weight #4. Resume general medical care with PCP

## 2013-08-01 ENCOUNTER — Other Ambulatory Visit: Payer: Self-pay | Admitting: *Deleted

## 2013-08-01 DIAGNOSIS — Z01810 Encounter for preprocedural cardiovascular examination: Secondary | ICD-10-CM

## 2013-08-01 DIAGNOSIS — N184 Chronic kidney disease, stage 4 (severe): Secondary | ICD-10-CM

## 2013-08-23 ENCOUNTER — Encounter: Payer: Self-pay | Admitting: Surgery

## 2013-08-26 ENCOUNTER — Encounter: Payer: Self-pay | Admitting: Surgery

## 2013-08-26 ENCOUNTER — Encounter (INDEPENDENT_AMBULATORY_CARE_PROVIDER_SITE_OTHER): Payer: Self-pay

## 2013-08-26 ENCOUNTER — Ambulatory Visit (INDEPENDENT_AMBULATORY_CARE_PROVIDER_SITE_OTHER): Payer: Medicare Other | Admitting: Surgery

## 2013-08-26 ENCOUNTER — Ambulatory Visit (HOSPITAL_COMMUNITY)
Admission: RE | Admit: 2013-08-26 | Discharge: 2013-08-26 | Disposition: A | Discharge status: Home / Self Care | Payer: Medicare Other | Source: Ambulatory Visit | Attending: Surgery | Admitting: Surgery

## 2013-08-26 ENCOUNTER — Ambulatory Visit (INDEPENDENT_AMBULATORY_CARE_PROVIDER_SITE_OTHER)
Admission: RE | Admit: 2013-08-26 | Discharge: 2013-08-26 | Disposition: A | Discharge status: Home / Self Care | Payer: Medicare Other | Source: Ambulatory Visit | Attending: Surgery | Admitting: Surgery

## 2013-08-26 VITALS — BP 137/55 | HR 82 | Ht 72.0 in | Wt 280.2 lb

## 2013-08-26 DIAGNOSIS — N186 End stage renal disease: Secondary | ICD-10-CM

## 2013-08-26 DIAGNOSIS — N184 Chronic kidney disease, stage 4 (severe): Secondary | ICD-10-CM

## 2013-08-26 DIAGNOSIS — Z01810 Encounter for preprocedural cardiovascular examination: Secondary | ICD-10-CM | POA: Diagnosis not present

## 2013-08-26 NOTE — Progress Notes (Signed)
Patient name: Chad Carter MRN: [id number] DOB: October 21, 1933 Sex: male   Referred by: Dr. Noah Charon  Reason for referral:  Chief Complaint  Patient presents with  . New Evaluation    eval for AVF     HISTORY OF PRESENT ILLNESS: This is a very pleasant 80 year old gentleman who is referred today for discussions regarding dialysis access.  The patient has chronic stage IV renal disease secondary to diabetes and hypertension.  The patient has been a diabetic for many years.  He does not know his most recent hemoglobin A1c.  He is starting to suffer from neuropathy in both lower extremities secondary to his diabetes.  He is on insulin.  He has stage IV renal disease.  He is on Neupogen for anemia.  He also has secondary hyperparathyroidism.  He also suffers from shortness of breath.  His most recent creatinine was 3.2.  He is right-handed.  Past Medical History  Diagnosis Date  . Diabetes mellitus type 2, insulin dependent   . Hypertension   . High cholesterol   . High triglycerides   . Obesity   . Sleep apnea, obstructive     never fitted for CPAP  . Duodenal ulcer with hemorrhage 10/2009, 01/2012  . Irritable bowel syndrome (IBS)     diarrhea predominent  . Anemia due to blood loss 10/2009, 01/2012    2 PRBCs 01/2012.   . Vitamin B 12 deficiency   . Hypothyroidism   . Chronic kidney disease   . Hx of adenomatous colonic polyps 10/2009    colonoscopy in [facility]  . Diverticulosis 10/2009  . Internal hemorrhoids 10/2009  . Atrial fibrillation     Past Surgical History  Procedure Laterality Date  . Cholecystectomy    . Arm surg    . Total knee arthroplasty      bilateral  . Ulcer surg    . Esophagogastroduodenoscopy  01/25/2012    01/25/2012 EGD : Rachael Fee, placed 3 endoclips to oozing DU. of bulb.   . Colonoscopy    . Esophagogastroduodenoscopy  02/02/2012    Procedure: ESOPHAGOGASTRODUODENOSCOPY (EGD);  Surgeon: [name], MD;  Location: [facility] ENDOSCOPY;   Service: Endoscopy;  Laterality: N/A;    History   Social History  . Marital Status: Married    Spouse Name: Sallye Ober    Number of Children: 3  . Years of Education: N/A   Occupational History  . Retired    Social History Main Topics  . Smoking status: Never Smoker   . Smokeless tobacco: Never Used  . Alcohol Use: No  . Drug Use: No  . Sexual Activity: Not on file   Other Topics Concern  . Not on file   Social History Narrative  . No narrative on file    Family History  Problem Relation Age of Onset  . Uterine cancer Mother   . Diabetes Mother   . Cancer Mother   . Heart disease Mother   . Hyperlipidemia Mother   . Hypertension Mother   . Heart disease Father   . Varicose Veins Father   . Heart attack Father   . Diabetes Maternal Grandmother   . Diabetes Maternal Grandfather   . Diabetes Paternal Grandmother   . Diabetes Paternal Grandfather   . Colon cancer      unknown if in family  . Hypertension Sister     Allergies as of 08/26/2013 - Review Complete 08/26/2013  Allergen Reaction Noted  . Avandia [  rosiglitazone]  07/31/2012  . Niacin and related Rash 01/24/2012    Current Outpatient Prescriptions on File Prior to Visit  Medication Sig Dispense Refill  . amLODipine (NORVASC) 10 MG tablet Take 10 mg by mouth daily.      . diphenoxylate-atropine (LOMOTIL) 2.5-0.025 MG per tablet Take 1 tablet by mouth 2 (two) times daily. Take 1 tab 3 times daily as needed.      . furosemide (LASIX) 40 MG tablet Take 40 mg by mouth daily.       Marland Kitchen. gemfibrozil (LOPID) 600 MG tablet Take 600 mg by mouth 2 (two) times daily before a meal.      . levothyroxine (SYNTHROID, LEVOTHROID) 75 MCG tablet Take 75 mcg by mouth daily.      . Multiple Vitamin (MULTIVITAMIN) tablet Take 1 tablet by mouth daily.      . Vitamin D, Ergocalciferol, (DRISDOL) 50000 UNITS CAPS capsule Take 50,000 Units by mouth every 7 (seven) days.      . insulin glargine (LANTUS OPTICLIK) 100 UNIT/ML  injection Inject 40 Units into the skin at bedtime.  10 mL    . Insulin Syringe-Needle U-100 (RELION INSULIN SYR 1CC/30G) 30G X 5/16" 1 ML MISC by Does not apply route.      Marland Kitchen. omeprazole (PRILOSEC) 40 MG capsule Take 40 mg by mouth 2 (two) times daily.       No current facility-administered medications on file prior to visit.     REVIEW OF SYSTEMS: Cardiovascular: Positive for chest pressure, shortness of breath when lying flat, shortness of breath with exertion, pain in legs with walking and lying flat, leg swelling Pulmonary: Positive for productive cough Neurologic: Positive for lower extremity neuropathy Hematologic: No bleeding problems or clotting disorders. Musculoskeletal: No joint pain or joint swelling. Gastrointestinal: No blood in stool or hematemesis Genitourinary: No dysuria or hematuria. Psychiatric:: No history of major depression. Integumentary: Positive for rash Constitutional: No fever or chills.  PHYSICAL EXAMINATION: General: The patient appears their stated age.  Vital signs are BP 137/55  Pulse 82  Ht 6' (1.829 m)  Wt 280 lb 3.2 oz (127.098 kg)  BMI 37.99 kg/m2  SpO2 95% HEENT:  No gross abnormalities Pulmonary: Respirations are non-labored Musculoskeletal: There are no major deformities.   Neurologic: No focal weakness or paresthesias are detected, Skin: There are no ulcer or rashes noted. Psychiatric: The patient has normal affect. Cardiovascular: There is a regular rate and rhythm without significant murmur appreciated.  Palpable left radial pulse  Diagnostic Studies: Vein mapping was ordered and reviewed.  He has an excellent left cephalic vein with diameter measurements from 0.36 at the wrist up to 0.33 at the shoulder.  He has triphasic arterial waveforms.    Assessment:  Stage 4 renal disease Plan: I had a lengthy discussion regarding access placement today.  I feel he is a good candidate for a left radiocephalic fistula.  I discussed the risks  and benefits including the risk of steal syndrome, and the need for future interventions for maturation of the fistula.  The patient is still contemplating whether or not he once to start dialysis if his kidneys failed, however he has agreed to proceed with fistula creation.  This is been scheduled for Thursday, February 5.  Again, it would be a left radiocephalic fistula     V. Charlena CrossWells Brabham IV, M.D. [facility] [place] Office: [phone] Pager:  [phone]

## 2013-09-05 ENCOUNTER — Encounter: Payer: Self-pay | Admitting: Internal Medicine

## 2013-09-06 ENCOUNTER — Other Ambulatory Visit: Payer: Self-pay

## 2013-09-09 ENCOUNTER — Encounter (HOSPITAL_COMMUNITY): Payer: Self-pay | Admitting: Pharmacy Technician

## 2013-09-11 ENCOUNTER — Telehealth: Payer: Self-pay | Admitting: *Deleted

## 2013-09-11 ENCOUNTER — Inpatient Hospital Stay (HOSPITAL_COMMUNITY)
Admission: RE | Admit: 2013-09-11 | Discharge: 2013-09-11 | Disposition: A | Discharge status: Home / Self Care | Payer: Medicare Other | Source: Ambulatory Visit

## 2013-09-11 NOTE — Telephone Encounter (Signed)
Marylene Landngela from [facility] [place] [phone]( [phone]) called to tell me that Mr. Chad Carter had been [facility] with SOB, Hallucinations, increased weight gain and difficulty with swallowing. He is going to be admitted for several days and she said the family wanted us to postpone his 09-19-13 Left AVF that was planned. The patient is to have a surgical consult there because his creatinine has increased from 3.2 to 4.1. The family will call us when he gets out to give us any updates or if we need to reschedule his AVF.

## 2013-09-11 NOTE — Pre-Procedure Instructions (Signed)
Chad BarthelKenneth A Carter  09/11/2013   Your procedure is scheduled on:  Thursday, September 19, 2013 at 12:57 PM   Report to [facility] Short Stay (use Main Entrance "A'') at 8:30 AM.   Call this number if you have problems the morning of surgery: [phone]   Remember:   Do not eat food or drink liquids after midnight.    Take these medicines the morning of surgery with A SIP OF WATER:amLODipine (NORVASC) 10 MG tablet, doxazosin (CARDURA) 2 MG tablet, levothyroxine (SYNTHROID, LEVOTHROID) 75 MCG tablet,  metoprolol tartrate (LOPRESSOR) 25 MG tablet, ranitidine (ZANTAC) 150 MG capsule             Do not take any diabetic medications the morning of surgery   Do not wear jewelry,   Do not wear lotions, powders, or perfumes.   Do not shave 48 hours prior to surgery. Men may shave face and neck.  Do not bring valuables to the hospital.  [facility] is not responsible for any belongings or valuables.               Contacts, dentures or bridgework may not be worn into surgery.                 Patients discharged the day of surgery will not be allowed to drive home.   Name and phone number of your driver:    Special Instructions: Shower using CHG the night before surgery and the morning of surgery   Please read over the following fact sheets that you were given: Pain Booklet, Coughing and Deep Breathing and Surgical Site Infection Prevention

## 2013-09-19 ENCOUNTER — Encounter (HOSPITAL_COMMUNITY): Admission: RE | Payer: Self-pay | Source: Ambulatory Visit

## 2013-09-19 ENCOUNTER — Ambulatory Visit (HOSPITAL_COMMUNITY): Admission: RE | Admit: 2013-09-19 | Payer: Medicare Other | Source: Ambulatory Visit | Admitting: Surgery

## 2013-09-19 SURGERY — ARTERIOVENOUS (AV) FISTULA CREATION
Anesthesia: Monitor Anesthesia Care | Site: Arm Lower | Laterality: Left

## 2013-10-31 ENCOUNTER — Encounter (HOSPITAL_COMMUNITY): Payer: Self-pay | Admitting: *Deleted

## 2013-10-31 NOTE — Progress Notes (Signed)
Faxed request to [facility] for most recent EKG and CXR.

## 2013-11-01 ENCOUNTER — Encounter (HOSPITAL_COMMUNITY): Payer: Self-pay

## 2013-11-01 ENCOUNTER — Other Ambulatory Visit: Payer: Self-pay | Admitting: *Deleted

## 2013-11-01 ENCOUNTER — Ambulatory Visit (HOSPITAL_COMMUNITY): Payer: Medicare Other | Admitting: Anesthesiology

## 2013-11-01 ENCOUNTER — Ambulatory Visit (HOSPITAL_COMMUNITY)
Admission: RE | Admit: 2013-11-01 | Discharge: 2013-11-01 | Disposition: A | Discharge status: Home / Self Care | Payer: Medicare Other | Source: Ambulatory Visit | Attending: Surgery | Admitting: Surgery

## 2013-11-01 ENCOUNTER — Ambulatory Visit (HOSPITAL_COMMUNITY): Payer: Medicare Other

## 2013-11-01 ENCOUNTER — Encounter (HOSPITAL_COMMUNITY): Payer: Medicare Other | Admitting: Anesthesiology

## 2013-11-01 ENCOUNTER — Telehealth: Payer: Self-pay | Admitting: Surgery

## 2013-11-01 ENCOUNTER — Encounter (HOSPITAL_COMMUNITY)
Admission: RE | Disposition: A | Discharge status: Home / Self Care | Payer: Self-pay | Source: Ambulatory Visit | Attending: Surgery

## 2013-11-01 DIAGNOSIS — N186 End stage renal disease: Secondary | ICD-10-CM

## 2013-11-01 DIAGNOSIS — E7800 Pure hypercholesterolemia, unspecified: Secondary | ICD-10-CM | POA: Insufficient documentation

## 2013-11-01 DIAGNOSIS — Z9981 Dependence on supplemental oxygen: Secondary | ICD-10-CM | POA: Insufficient documentation

## 2013-11-01 DIAGNOSIS — Z96659 Presence of unspecified artificial knee joint: Secondary | ICD-10-CM | POA: Insufficient documentation

## 2013-11-01 DIAGNOSIS — E119 Type 2 diabetes mellitus without complications: Secondary | ICD-10-CM | POA: Insufficient documentation

## 2013-11-01 DIAGNOSIS — Z4931 Encounter for adequacy testing for hemodialysis: Secondary | ICD-10-CM

## 2013-11-01 DIAGNOSIS — N184 Chronic kidney disease, stage 4 (severe): Secondary | ICD-10-CM | POA: Insufficient documentation

## 2013-11-01 DIAGNOSIS — I129 Hypertensive chronic kidney disease with stage 1 through stage 4 chronic kidney disease, or unspecified chronic kidney disease: Secondary | ICD-10-CM | POA: Insufficient documentation

## 2013-11-01 DIAGNOSIS — G4733 Obstructive sleep apnea (adult) (pediatric): Secondary | ICD-10-CM | POA: Insufficient documentation

## 2013-11-01 DIAGNOSIS — E538 Deficiency of other specified B group vitamins: Secondary | ICD-10-CM | POA: Insufficient documentation

## 2013-11-01 DIAGNOSIS — Z794 Long term (current) use of insulin: Secondary | ICD-10-CM | POA: Insufficient documentation

## 2013-11-01 DIAGNOSIS — K219 Gastro-esophageal reflux disease without esophagitis: Secondary | ICD-10-CM | POA: Insufficient documentation

## 2013-11-01 DIAGNOSIS — I509 Heart failure, unspecified: Secondary | ICD-10-CM | POA: Insufficient documentation

## 2013-11-01 DIAGNOSIS — I4891 Unspecified atrial fibrillation: Secondary | ICD-10-CM | POA: Insufficient documentation

## 2013-11-01 HISTORY — DX: Heart failure, unspecified: I50.9

## 2013-11-01 HISTORY — DX: Other complications of anesthesia, initial encounter: T88.59XA

## 2013-11-01 HISTORY — DX: Shortness of breath: R06.02

## 2013-11-01 HISTORY — DX: Adverse effect of unspecified anesthetic, initial encounter: T41.45XA

## 2013-11-01 HISTORY — DX: Pneumonia, unspecified organism: J18.9

## 2013-11-01 HISTORY — DX: Gastro-esophageal reflux disease without esophagitis: K21.9

## 2013-11-01 HISTORY — PX: AV FISTULA PLACEMENT: SHX1204

## 2013-11-01 HISTORY — DX: Unspecified osteoarthritis, unspecified site: M19.90

## 2013-11-01 LAB — POCT I-STAT 4, (NA,K, GLUC, HGB,HCT)
Glucose, Bld: 161 mg/dL — ABNORMAL HIGH (ref 70–99)
HCT: 37 % — ABNORMAL LOW (ref 39.0–52.0)
Hemoglobin: 12.6 g/dL — ABNORMAL LOW (ref 13.0–17.0)
Potassium: 4.5 mEq/L (ref 3.7–5.3)
Sodium: 142 mEq/L (ref 137–147)

## 2013-11-01 LAB — GLUCOSE, CAPILLARY: Glucose-Capillary: 141 mg/dL — ABNORMAL HIGH (ref 70–99)

## 2013-11-01 SURGERY — ARTERIOVENOUS (AV) FISTULA CREATION
Anesthesia: Monitor Anesthesia Care | Site: Arm Lower | Laterality: Left

## 2013-11-01 MED ORDER — 0.9 % SODIUM CHLORIDE (POUR BTL) OPTIME
TOPICAL | Status: DC | PRN
  Administered 2013-11-01: 1000 mL

## 2013-11-01 MED ORDER — SODIUM CHLORIDE 0.9 % IV SOLN: INTRAVENOUS | Status: DC

## 2013-11-01 MED ORDER — FENTANYL CITRATE 0.05 MG/ML IJ SOLN
INTRAMUSCULAR | Status: DC | PRN
  Administered 2013-11-01 (×2): 25 ug via INTRAVENOUS

## 2013-11-01 MED ORDER — PROPOFOL 10 MG/ML IV BOLUS
INTRAVENOUS | Status: AC
  Filled 2013-11-01: qty 20

## 2013-11-01 MED ORDER — LIDOCAINE-EPINEPHRINE (PF) 1 %-1:200000 IJ SOLN
INTRAMUSCULAR | Status: DC | PRN
  Administered 2013-11-01: 30 mL

## 2013-11-01 MED ORDER — MIDAZOLAM HCL 2 MG/2ML IJ SOLN
INTRAMUSCULAR | Status: AC
  Filled 2013-11-01: qty 2

## 2013-11-01 MED ORDER — MIDAZOLAM HCL 5 MG/5ML IJ SOLN
INTRAMUSCULAR | Status: DC | PRN
  Administered 2013-11-01 (×2): 1 mg via INTRAVENOUS

## 2013-11-01 MED ORDER — DEXTROSE 5 % IV SOLN
1.5000 g | INTRAVENOUS | Status: AC
  Administered 2013-11-01: 1.5 g via INTRAVENOUS
  Filled 2013-11-01: qty 1.5

## 2013-11-01 MED ORDER — OXYCODONE-ACETAMINOPHEN 5-325 MG PO TABS: 1.0000 | ORAL_TABLET | Freq: Four times a day (QID) | ORAL | Status: AC | PRN

## 2013-11-01 MED ORDER — ONDANSETRON HCL 4 MG/2ML IJ SOLN
INTRAMUSCULAR | Status: DC | PRN
  Administered 2013-11-01: 4 mg via INTRAVENOUS

## 2013-11-01 MED ORDER — PROTAMINE SULFATE 10 MG/ML IV SOLN
INTRAVENOUS | Status: DC | PRN
  Administered 2013-11-01: 15 mg via INTRAVENOUS
  Administered 2013-11-01: 10 mg via INTRAVENOUS

## 2013-11-01 MED ORDER — SODIUM CHLORIDE 0.9 % IV SOLN
INTRAVENOUS | Status: DC | PRN
  Administered 2013-11-01: 07:00:00 via INTRAVENOUS

## 2013-11-01 MED ORDER — SODIUM CHLORIDE 0.9 % IR SOLN
Status: DC | PRN
  Administered 2013-11-01: 08:00:00

## 2013-11-01 MED ORDER — ONDANSETRON HCL 4 MG/2ML IJ SOLN
INTRAMUSCULAR | Status: AC
  Filled 2013-11-01: qty 2

## 2013-11-01 MED ORDER — FENTANYL CITRATE 0.05 MG/ML IJ SOLN
INTRAMUSCULAR | Status: AC
  Filled 2013-11-01: qty 5

## 2013-11-01 MED ORDER — HEPARIN SODIUM (PORCINE) 1000 UNIT/ML IJ SOLN
INTRAMUSCULAR | Status: DC | PRN
  Administered 2013-11-01: 3000 [IU] via INTRAVENOUS

## 2013-11-01 SURGICAL SUPPLY — 32 items
ARMBAND PINK RESTRICT EXTREMIT (MISCELLANEOUS) ×3 IMPLANT
CANISTER SUCTION 2500CC (MISCELLANEOUS) ×3 IMPLANT
CLIP TI MEDIUM 6 (CLIP) ×3 IMPLANT
CLIP TI WIDE RED SMALL 6 (CLIP) ×3 IMPLANT
COVER PROBE W GEL 5X96 (DRAPES) ×3 IMPLANT
COVER SURGICAL LIGHT HANDLE (MISCELLANEOUS) ×3 IMPLANT
DERMABOND ADVANCED (GAUZE/BANDAGES/DRESSINGS) ×2
DERMABOND ADVANCED .7 DNX12 (GAUZE/BANDAGES/DRESSINGS) ×1 IMPLANT
ELECT REM PT RETURN 9FT ADLT (ELECTROSURGICAL) ×3
ELECTRODE REM PT RTRN 9FT ADLT (ELECTROSURGICAL) ×1 IMPLANT
GLOVE BIOGEL PI IND STRL 7.5 (GLOVE) ×1 IMPLANT
GLOVE BIOGEL PI INDICATOR 7.5 (GLOVE) ×2
GLOVE SURG SS PI 7.5 STRL IVOR (GLOVE) ×3 IMPLANT
GOWN STRL REUS W/ TWL LRG LVL3 (GOWN DISPOSABLE) ×2 IMPLANT
GOWN STRL REUS W/ TWL XL LVL3 (GOWN DISPOSABLE) ×1 IMPLANT
GOWN STRL REUS W/TWL LRG LVL3 (GOWN DISPOSABLE) ×4
GOWN STRL REUS W/TWL XL LVL3 (GOWN DISPOSABLE) ×2
HEMOSTAT SNOW SURGICEL 2X4 (HEMOSTASIS) IMPLANT
KIT BASIN OR (CUSTOM PROCEDURE TRAY) ×3 IMPLANT
KIT ROOM TURNOVER OR (KITS) ×3 IMPLANT
NS IRRIG 1000ML POUR BTL (IV SOLUTION) ×3 IMPLANT
PACK CV ACCESS (CUSTOM PROCEDURE TRAY) ×3 IMPLANT
PAD ARMBOARD 7.5X6 YLW CONV (MISCELLANEOUS) ×6 IMPLANT
SUT PROLENE 6 0 BV (SUTURE) ×6 IMPLANT
SUT PROLENE 6 0 CC (SUTURE) ×3 IMPLANT
SUT VIC AB 3-0 SH 27 (SUTURE) ×2
SUT VIC AB 3-0 SH 27X BRD (SUTURE) ×1 IMPLANT
SUT VICRYL 4-0 PS2 18IN ABS (SUTURE) ×3 IMPLANT
TOWEL OR 17X24 6PK STRL BLUE (TOWEL DISPOSABLE) ×3 IMPLANT
TOWEL OR 17X26 10 PK STRL BLUE (TOWEL DISPOSABLE) ×3 IMPLANT
UNDERPAD 30X30 INCONTINENT (UNDERPADS AND DIAPERS) ×3 IMPLANT
WATER STERILE IRR 1000ML POUR (IV SOLUTION) ×3 IMPLANT

## 2013-11-01 NOTE — Transfer of Care (Signed)
Immediate Anesthesia Transfer of Care Note  Patient: Chad BarthelKenneth A Carter  Procedure(s) Performed: Procedure(s): ARTERIOVENOUS (AV) FISTULA CREATION- LEFT (Left)  Patient Location: PACU  Anesthesia Type:MAC  Level of Consciousness: awake, alert , oriented and patient cooperative  Airway & Oxygen Therapy: Patient Spontanous Breathing and Patient connected to nasal cannula oxygen  Post-op Assessment: Report given to PACU RN and Post -op Vital signs reviewed and stable  Post vital signs: Reviewed  Complications: No apparent anesthesia complications

## 2013-11-01 NOTE — Anesthesia Procedure Notes (Signed)
Procedure Name: MAC Date/Time: 11/01/2013 8:05 AM Performed by: Lovie CholOCK, Freedom Peddy K Pre-anesthesia Checklist: Patient identified, Emergency Drugs available, Suction available, Patient being monitored and Timeout performed Patient Re-evaluated:Patient Re-evaluated prior to inductionOxygen Delivery Method: Simple face mask

## 2013-11-01 NOTE — H&P (View-Only) (Signed)
   Patient name: Chad Carter MRN: [id number] DOB: 10/20/1933 Sex: male   Referred by: Dr. Maldonato  Reason for referral:  Chief Complaint  Patient presents with  . New Evaluation    eval for AVF     HISTORY OF PRESENT ILLNESS: This is a very pleasant 79-year-old gentleman who is referred today for discussions regarding dialysis access.  The patient has chronic stage IV renal disease secondary to diabetes and hypertension.  The patient has been a diabetic for many years.  He does not know his most recent hemoglobin A1c.  He is starting to suffer from neuropathy in both lower extremities secondary to his diabetes.  He is on insulin.  He has stage IV renal disease.  He is on Neupogen for anemia.  He also has secondary hyperparathyroidism.  He also suffers from shortness of breath.  His most recent creatinine was 3.2.  He is right-handed.  Past Medical History  Diagnosis Date  . Diabetes mellitus type 2, insulin dependent   . Hypertension   . High cholesterol   . High triglycerides   . Obesity   . Sleep apnea, obstructive     never fitted for CPAP  . Duodenal ulcer with hemorrhage 10/2009, 01/2012  . Irritable bowel syndrome (IBS)     diarrhea predominent  . Anemia due to blood loss 10/2009, 01/2012    2 PRBCs 01/2012.   . Vitamin B 12 deficiency   . Hypothyroidism   . Chronic kidney disease   . Hx of adenomatous colonic polyps 10/2009    colonoscopy in [place]  . Diverticulosis 10/2009  . Internal hemorrhoids 10/2009  . Atrial fibrillation     Past Surgical History  Procedure Laterality Date  . Cholecystectomy    . Arm surg    . Total knee arthroplasty      bilateral  . Ulcer surg    . Esophagogastroduodenoscopy  01/25/2012    01/25/2012 EGD : Daniel P Jacobs, placed 3 endoclips to oozing DU. of bulb.   . Colonoscopy    . Esophagogastroduodenoscopy  02/02/2012    Procedure: ESOPHAGOGASTRODUODENOSCOPY (EGD);  Surgeon: Carl E Gessner, MD;  Location: [facility] ENDOSCOPY;   Service: Endoscopy;  Laterality: N/A;    History   Social History  . Marital Status: Married    Spouse Name: Louise    Number of Children: 3  . Years of Education: N/A   Occupational History  . Retired    Social History Main Topics  . Smoking status: Never Smoker   . Smokeless tobacco: Never Used  . Alcohol Use: No  . Drug Use: No  . Sexual Activity: Not on file   Other Topics Concern  . Not on file   Social History Narrative  . No narrative on file    Family History  Problem Relation Age of Onset  . Uterine cancer Mother   . Diabetes Mother   . Cancer Mother   . Heart disease Mother   . Hyperlipidemia Mother   . Hypertension Mother   . Heart disease Father   . Varicose Veins Father   . Heart attack Father   . Diabetes Maternal Grandmother   . Diabetes Maternal Grandfather   . Diabetes Paternal Grandmother   . Diabetes Paternal Grandfather   . Colon cancer      unknown if in family  . Hypertension Sister     Allergies as of 08/26/2013 - Review Complete 08/26/2013  Allergen Reaction Noted  . Avandia [  rosiglitazone]  07/31/2012  . Niacin and related Rash 01/24/2012    Current Outpatient Prescriptions on File Prior to Visit  Medication Sig Dispense Refill  . amLODipine (NORVASC) 10 MG tablet Take 10 mg by mouth daily.      . diphenoxylate-atropine (LOMOTIL) 2.5-0.025 MG per tablet Take 1 tablet by mouth 2 (two) times daily. Take 1 tab 3 times daily as needed.      . furosemide (LASIX) 40 MG tablet Take 40 mg by mouth daily.       . gemfibrozil (LOPID) 600 MG tablet Take 600 mg by mouth 2 (two) times daily before a meal.      . levothyroxine (SYNTHROID, LEVOTHROID) 75 MCG tablet Take 75 mcg by mouth daily.      . Multiple Vitamin (MULTIVITAMIN) tablet Take 1 tablet by mouth daily.      . Vitamin D, Ergocalciferol, (DRISDOL) 50000 UNITS CAPS capsule Take 50,000 Units by mouth every 7 (seven) days.      . insulin glargine (LANTUS OPTICLIK) 100 UNIT/ML  injection Inject 40 Units into the skin at bedtime.  10 mL    . Insulin Syringe-Needle U-100 (RELION INSULIN SYR 1CC/30G) 30G X 5/16" 1 ML MISC by Does not apply route.      . omeprazole (PRILOSEC) 40 MG capsule Take 40 mg by mouth 2 (two) times daily.       No current facility-administered medications on file prior to visit.     REVIEW OF SYSTEMS: Cardiovascular: Positive for chest pressure, shortness of breath when lying flat, shortness of breath with exertion, pain in legs with walking and lying flat, leg swelling Pulmonary: Positive for productive cough Neurologic: Positive for lower extremity neuropathy Hematologic: No bleeding problems or clotting disorders. Musculoskeletal: No joint pain or joint swelling. Gastrointestinal: No blood in stool or hematemesis Genitourinary: No dysuria or hematuria. Psychiatric:: No history of major depression. Integumentary: Positive for rash Constitutional: No fever or chills.  PHYSICAL EXAMINATION: General: The patient appears their stated age.  Vital signs are BP 137/55  Pulse 82  Ht 6' (1.829 m)  Wt 280 lb 3.2 oz (127.098 kg)  BMI 37.99 kg/m2  SpO2 95% HEENT:  No gross abnormalities Pulmonary: Respirations are non-labored Musculoskeletal: There are no major deformities.   Neurologic: No focal weakness or paresthesias are detected, Skin: There are no ulcer or rashes noted. Psychiatric: The patient has normal affect. Cardiovascular: There is a regular rate and rhythm without significant murmur appreciated.  Palpable left radial pulse  Diagnostic Studies: Vein mapping was ordered and reviewed.  He has an excellent left cephalic vein with diameter measurements from 0.36 at the wrist up to 0.33 at the shoulder.  He has triphasic arterial waveforms.    Assessment:  Stage 4 renal disease Plan: I had a lengthy discussion regarding access placement today.  I feel he is a good candidate for a left radiocephalic fistula.  I discussed the risks  and benefits including the risk of steal syndrome, and the need for future interventions for maturation of the fistula.  The patient is still contemplating whether or not he once to start dialysis if his kidneys failed, however he has agreed to proceed with fistula creation.  This is been scheduled for Thursday, February 5.  Again, it would be a left radiocephalic fistula     V. Wells Brabham IV, M.D. [facility]  Office: [phone] Pager:  [phone]     No current facility-administered medications on file prior to visit.          REVIEW OF SYSTEMS: Cardiovascular: Positive for chest pressure, shortness of breath when lying flat, shortness of breath with exertion, pain in legs with walking and lying flat, leg swelling Pulmonary: Positive for productive cough Neurologic: Positive for lower extremity neuropathy Hematologic: No bleeding problems or clotting disorders. Musculoskeletal: No joint pain or joint swelling. Gastrointestinal: No blood in stool or hematemesis Genitourinary: No dysuria or hematuria. Psychiatric:: No history of major depression. Integumentary: Positive for rash Constitutional: No fever or chills.   PHYSICAL EXAMINATION: General: The patient appears their stated age.  Vital signs are BP 137/55  Pulse 82  Ht 6' (1.829 m)  Wt 280 lb 3.2 oz (127.098 kg)  BMI 37.99 kg/m2  SpO2 95% HEENT:  No gross abnormalities Pulmonary: Respirations are non-labored Musculoskeletal: There are no major deformities.    Neurologic: No focal weakness or paresthesias are detected, Skin: There are no ulcer or rashes noted. Psychiatric: The patient has normal affect. Cardiovascular: There is a regular rate and rhythm without significant murmur appreciated.  Palpable left radial pulse   Diagnostic  Studies: Vein mapping was ordered and reviewed.  He has an excellent left cephalic vein with diameter measurements from 0.36 at the wrist up to 0.33 at the shoulder.  He has triphasic arterial waveforms.       Assessment:   Stage 4 renal disease Plan: I had a lengthy discussion regarding access placement today.  I feel he is a good candidate for a left radiocephalic fistula.  I discussed the risks and benefits including the risk of steal syndrome, and the need for future interventions for maturation of the fistula.  The patient is still contemplating whether or not he once to start dialysis if his kidneys failed, however he has agreed to proceed with fistula creation.  This is been scheduled for Thursday, February 5.  Again, it would be a left radiocephalic fistula         V. Charlena Cross, M.D. [facility] [place] Office: [phone] Pager:  [phone]

## 2013-11-01 NOTE — Progress Notes (Signed)
   Patient name: Chad Carter MRN: [id number] DOB: 08/18/1933 Sex: male   Referred by: Dr. Maldonato  Reason for referral:  Chief Complaint  Patient presents with  . New Evaluation    eval for AVF     HISTORY OF PRESENT ILLNESS: This is a very pleasant 79-year-old gentleman who is referred today for discussions regarding dialysis access.  The patient has chronic stage IV renal disease secondary to diabetes and hypertension.  The patient has been a diabetic for many years.  He does not know his most recent hemoglobin A1c.  He is starting to suffer from neuropathy in both lower extremities secondary to his diabetes.  He is on insulin.  He has stage IV renal disease.  He is on Neupogen for anemia.  He also has secondary hyperparathyroidism.  He also suffers from shortness of breath.  His most recent creatinine was 3.2.  He is right-handed.  Past Medical History  Diagnosis Date  . Diabetes mellitus type 2, insulin dependent   . Hypertension   . High cholesterol   . High triglycerides   . Obesity   . Sleep apnea, obstructive     never fitted for CPAP  . Duodenal ulcer with hemorrhage 10/2009, 01/2012  . Irritable bowel syndrome (IBS)     diarrhea predominent  . Anemia due to blood loss 10/2009, 01/2012    2 PRBCs 01/2012.   . Vitamin B 12 deficiency   . Hypothyroidism   . Chronic kidney disease   . Hx of adenomatous colonic polyps 10/2009    colonoscopy in [place]  . Diverticulosis 10/2009  . Internal hemorrhoids 10/2009  . Atrial fibrillation     Past Surgical History  Procedure Laterality Date  . Cholecystectomy    . Arm surg    . Total knee arthroplasty      bilateral  . Ulcer surg    . Esophagogastroduodenoscopy  01/25/2012    01/25/2012 EGD : Daniel P Jacobs, placed 3 endoclips to oozing DU. of bulb.   . Colonoscopy    . Esophagogastroduodenoscopy  02/02/2012    Procedure: ESOPHAGOGASTRODUODENOSCOPY (EGD);  Surgeon: Carl E Gessner, MD;  Location: [facility];   Service: Endoscopy;  Laterality: N/A;    History   Social History  . Marital Status: Married    Spouse Name: Louise    Number of Children: 3  . Years of Education: N/A   Occupational History  . Retired    Social History Main Topics  . Smoking status: Never Smoker   . Smokeless tobacco: Never Used  . Alcohol Use: No  . Drug Use: No  . Sexual Activity: Not on file   Other Topics Concern  . Not on file   Social History Narrative  . No narrative on file    Family History  Problem Relation Age of Onset  . Uterine cancer Mother   . Diabetes Mother   . Cancer Mother   . Heart disease Mother   . Hyperlipidemia Mother   . Hypertension Mother   . Heart disease Father   . Varicose Veins Father   . Heart attack Father   . Diabetes Maternal Grandmother   . Diabetes Maternal Grandfather   . Diabetes Paternal Grandmother   . Diabetes Paternal Grandfather   . Colon cancer      unknown if in family  . Hypertension Sister     Allergies as of 08/26/2013 - Review Complete 08/26/2013  Allergen Reaction Noted  . Avandia [  rosiglitazone]  07/31/2012  . Niacin and related Rash 01/24/2012    Current Outpatient Prescriptions on File Prior to Visit  Medication Sig Dispense Refill  . amLODipine (NORVASC) 10 MG tablet Take 10 mg by mouth daily.      . diphenoxylate-atropine (LOMOTIL) 2.5-0.025 MG per tablet Take 1 tablet by mouth 2 (two) times daily. Take 1 tab 3 times daily as needed.      . furosemide (LASIX) 40 MG tablet Take 40 mg by mouth daily.       . gemfibrozil (LOPID) 600 MG tablet Take 600 mg by mouth 2 (two) times daily before a meal.      . levothyroxine (SYNTHROID, LEVOTHROID) 75 MCG tablet Take 75 mcg by mouth daily.      . Multiple Vitamin (MULTIVITAMIN) tablet Take 1 tablet by mouth daily.      . Vitamin D, Ergocalciferol, (DRISDOL) 50000 UNITS CAPS capsule Take 50,000 Units by mouth every 7 (seven) days.      . insulin glargine (LANTUS OPTICLIK) 100 UNIT/ML  injection Inject 40 Units into the skin at bedtime.  10 mL    . Insulin Syringe-Needle U-100 (RELION INSULIN SYR 1CC/30G) 30G X 5/16" 1 ML MISC by Does not apply route.      . omeprazole (PRILOSEC) 40 MG capsule Take 40 mg by mouth 2 (two) times daily.       No current facility-administered medications on file prior to visit.     REVIEW OF SYSTEMS: Cardiovascular: Positive for chest pressure, shortness of breath when lying flat, shortness of breath with exertion, pain in legs with walking and lying flat, leg swelling Pulmonary: Positive for productive cough Neurologic: Positive for lower extremity neuropathy Hematologic: No bleeding problems or clotting disorders. Musculoskeletal: No joint pain or joint swelling. Gastrointestinal: No blood in stool or hematemesis Genitourinary: No dysuria or hematuria. Psychiatric:: No history of major depression. Integumentary: Positive for rash Constitutional: No fever or chills.  PHYSICAL EXAMINATION: General: The patient appears their stated age.  Vital signs are BP 137/55  Pulse 82  Ht 6' (1.829 m)  Wt 280 lb 3.2 oz (127.098 kg)  BMI 37.99 kg/m2  SpO2 95% HEENT:  No gross abnormalities Pulmonary: Respirations are non-labored Musculoskeletal: There are no major deformities.   Neurologic: No focal weakness or paresthesias are detected, Skin: There are no ulcer or rashes noted. Psychiatric: The patient has normal affect. Cardiovascular: There is a regular rate and rhythm without significant murmur appreciated.  Palpable left radial pulse  Diagnostic Studies: Vein mapping was ordered and reviewed.  He has an excellent left cephalic vein with diameter measurements from 0.36 at the wrist up to 0.33 at the shoulder.  He has triphasic arterial waveforms.    Assessment:  Stage 4 renal disease Plan: I had a lengthy discussion regarding access placement today.  I feel he is a good candidate for a left radiocephalic fistula.  I discussed the risks  and benefits including the risk of steal syndrome, and the need for future interventions for maturation of the fistula.  The patient is still contemplating whether or not he once to start dialysis if his kidneys failed, however he has agreed to proceed with fistula creation.  This is been scheduled for Thursday, February 5.  Again, it would be a left radiocephalic fistula     V. Wells Brabham IV, M.D. [facility] [place] Office: [phone] Pager:  [phone]     No current facility-administered medications on file prior to visit.          REVIEW OF SYSTEMS: Cardiovascular: Positive for chest pressure, shortness of breath when lying flat, shortness of breath with exertion, pain in legs with walking and lying flat, leg swelling Pulmonary: Positive for productive cough Neurologic: Positive for lower extremity neuropathy Hematologic: No bleeding problems or clotting disorders. Musculoskeletal: No joint pain or joint swelling. Gastrointestinal: No blood in stool or hematemesis Genitourinary: No dysuria or hematuria. Psychiatric:: No history of major depression. Integumentary: Positive for rash Constitutional: No fever or chills.   PHYSICAL EXAMINATION: General: The patient appears their stated age.  Vital signs are BP 137/55  Pulse 82  Ht 6' (1.829 m)  Wt 280 lb 3.2 oz (127.098 kg)  BMI 37.99 kg/m2  SpO2 95% HEENT:  No gross abnormalities Pulmonary: Respirations are non-labored Musculoskeletal: There are no major deformities.    Neurologic: No focal weakness or paresthesias are detected, Skin: There are no ulcer or rashes noted. Psychiatric: The patient has normal affect. Cardiovascular: There is a regular rate and rhythm without significant murmur appreciated.  Palpable left radial pulse   Diagnostic  Studies: Vein mapping was ordered and reviewed.  He has an excellent left cephalic vein with diameter measurements from 0.36 at the wrist up to 0.33 at the shoulder.  He has triphasic arterial waveforms.       Assessment:   Stage 4 renal disease Plan: I had a lengthy discussion regarding access placement today.  I feel he is a good candidate for a left radiocephalic fistula.  I discussed the risks and benefits including the risk of steal syndrome, and the need for future interventions for maturation of the fistula.  The patient is still contemplating whether or not he once to start dialysis if his kidneys failed, however he has agreed to proceed with fistula creation.  This is been scheduled for Thursday, February 5.  Again, it would be a left radiocephalic fistula         V. Charlena Cross, M.D. [facility] [place] Office: [phone] Pager:  [phone]

## 2013-11-01 NOTE — Anesthesia Postprocedure Evaluation (Signed)
  Anesthesia Post-op Note  Patient: Chad BarthelKenneth A Carter  Procedure(s) Performed: Procedure(s): ARTERIOVENOUS (AV) FISTULA CREATION- LEFT (Left)  Patient Location: PACU  Anesthesia Type:MAC  Level of Consciousness: awake, alert  and oriented  Airway and Oxygen Therapy: Patient Spontanous Breathing and Patient connected to nasal cannula oxygen  Post-op Pain: mild  Post-op Assessment: Post-op Vital signs reviewed, Patient's Cardiovascular Status Stable, Respiratory Function Stable, Patent Airway, No signs of Nausea or vomiting and Pain level controlled  Post-op Vital Signs: Reviewed and stable  Complications: No apparent anesthesia complications

## 2013-11-01 NOTE — Progress Notes (Signed)
Patient has tested positive for sleep apnea using the STOP BANG tool.

## 2013-11-01 NOTE — Anesthesia Preprocedure Evaluation (Addendum)
Anesthesia Evaluation  Patient identified by MRN, date of birth, ID band Patient awake    Reviewed: Allergy & Precautions, H&P , NPO status , Patient's Chart, lab work & pertinent test results, reviewed documented beta blocker date and time   Airway Mallampati: II TM Distance: >3 FB Neck ROM: Full    Dental  (+) Edentulous Upper, Dental Advisory Given, Poor Dentition   Pulmonary at rest, lying and Long-Term Oxygen TherapyShortness of breath: 24 hour home O2., sleep apnea and Oxygen sleep apnea ,    + decreased breath sounds      Cardiovascular hypertension, Pt. on medications and Pt. on home beta blockers +CHF and + DOE Rhythm:Regular Rate:Normal     Neuro/Psych negative neurological ROS     GI/Hepatic Neg liver ROS, PUD, GERD-  Medicated and Controlled,  Endo/Other  diabetes, Type 2, Oral Hypoglycemic AgentsHypothyroidism Morbid obesity  Renal/GU DialysisRenal diseaseLast HD 3/19     Musculoskeletal   Abdominal   Peds  Hematology  (+) anemia ,   Anesthesia Other Findings   Reproductive/Obstetrics negative OB ROS                          Anesthesia Physical Anesthesia Plan  ASA: III  Anesthesia Plan: MAC   Post-op Pain Management:    Induction: Intravenous  Airway Management Planned: Simple Face Mask  Additional Equipment:   Intra-op Plan:   Post-operative Plan:   Informed Consent:   Plan Discussed with: CRNA, Anesthesiologist and Surgeon  Anesthesia Plan Comments:         Anesthesia Quick Evaluation

## 2013-11-01 NOTE — Interval H&P Note (Signed)
History and Physical Interval Note:  11/01/2013 6:36 AM  Chad BarthelKenneth A Poullard  has presented today for surgery, with the diagnosis of End Stage Renal Disease  The various methods of treatment have been discussed with the patient and family. After consideration of risks, benefits and other options for treatment, the patient has consented to  Procedure(s): ARTERIOVENOUS (AV) FISTULA CREATION- LEFT (Left) as a surgical intervention .  The patient's history has been reviewed, patient examined, no change in status, stable for surgery.  I have reviewed the patient's chart and labs.  Questions were answered to the patient's satisfaction.     Annalaura Sauseda IV, V. WELLS

## 2013-11-01 NOTE — Discharge Instructions (Signed)

## 2013-11-01 NOTE — Telephone Encounter (Addendum)
Message copied by Fredrich BirksMILLIKAN, DANA P on Fri Nov 01, 2013  4:36 PM ------      Message from: Sharee PimpleMCCHESNEY, MARILYN K      Created: Fri Nov 01, 2013 10:22 AM      Regarding: Schedule                   ----- Message -----         From: Lars MageEmma M [name], PA-C         Sent: 11/01/2013   9:18 AM           To: [facility] Charge Pool            F/U in the office in 4 weeks with Dr. Myra GianottiBrabham with ultrasound AV fistula left forearm ------  11/01/13: spoke with patient to schedule, dpm

## 2013-11-01 NOTE — Preoperative (Signed)
Beta Blockers   Reason not to administer Beta Blockers:Not Applicable 

## 2013-11-01 NOTE — Op Note (Signed)
    Patient name: Chad BarthelKenneth A Carter MRN: [id number] DOB: 10/12/1933 Sex: male  11/01/2013 Pre-operative Diagnosis: End-stage renal disease Post-operative diagnosis:  Same Surgeon:  Jorge NyBRABHAM IV, V. WELLS Assistants:  Narda AmberM.  [name] Procedure:   Left radiocephalic fistula Anesthesia:  Local Blood Loss:  See anesthesia record Specimens:  None  Findings:   Vein, dilated to a #4 dilator.  The artery was approximately 2 mm.  Indications:  The patient comes in today for dialysis access.  He is currently using a catheter.  Procedure:  The patient was identified in the holding area and [facility]  The patient was then placed supine on the table. local anesthesia was administered.  The patient was prepped and draped in the usual sterile fashion.  A time out was called and antibiotics were administered.  The patient could not lie flat and therefore this was done in an upright position.  Minimal anesthesia was given.  Ultrasound was used to map the course of the cephalic vein in the upper arm.  1% lidocaine was used for local anesthesia.  A transverse incision was made just proximal to the wrist.  Sharp dissection was used to dissect out the vein.  This is a healthy-appearing vein about 2.5 mm in situ.  I then dissected out the radial artery.  This was a healthy 2.5 mm artery.  The vein was then marked with an ink pen for orientation.  It was ligated distally with a 3-0 silk tie.  I then proceeded to sequentially dilate the vein, it easily accepted a #4 dilator.  The patient was then given 3000 units of heparin.  After the heparin circulated, the radial artery was occluded with vascular clamps.  A #11 blade was used to make an arteriotomy which was extended longitudinally with Potts scissors.  The vein was cut to the appropriate length and spatulated.  A running anastomosis was created with 6-0 Prolene.  Prior to completion of the anastomosis, the appropriate flushing maneuvers were performed.  The  anastomosis was then completed.  I major that there were no changes within the vein.  There was an excellent thrill up the arm.  There was good Doppler signal distal to the anastomosis.  The wound was then closed with a layer of 30 LAR for Vicryl.   Disposition:  To PACU in stable condition.   Juleen ChinaV. Wells Altovise Wahler, M.D. [facility] [place] Office: [phone] Pager:  [phone]

## 2013-11-04 ENCOUNTER — Telehealth: Payer: Self-pay | Admitting: Surgery

## 2013-11-04 NOTE — Telephone Encounter (Addendum)
Message copied by Fredrich BirksMILLIKAN, DANA P on Mon Nov 04, 2013  3:57 PM ------      Message from: Sharee PimpleMCCHESNEY, MARILYN K      Created: Fri Nov 01, 2013  7:11 PM      Regarding: Schedule                   ----- Message -----         From: Nada LibmanVance W Brabham, MD         Sent: 11/01/2013  12:58 PM           To: Vvs Charge Pool            11/01/2013:            Left radiocephalic fistula      Assistant: Narda AmberM.  [name]      Surgeon: Myra GianottiBrabham                  Needs followup in 6 weeks with a duplex ultrasound ------  11/04/13: lm for pt re appt, dpm

## 2013-11-05 ENCOUNTER — Encounter (HOSPITAL_COMMUNITY): Payer: Self-pay | Admitting: Surgery

## 2013-11-29 ENCOUNTER — Encounter: Payer: Self-pay | Admitting: Surgery

## 2013-12-02 ENCOUNTER — Ambulatory Visit (INDEPENDENT_AMBULATORY_CARE_PROVIDER_SITE_OTHER): Payer: Self-pay | Admitting: Surgery

## 2013-12-02 ENCOUNTER — Ambulatory Visit (HOSPITAL_COMMUNITY)
Admission: RE | Admit: 2013-12-02 | Discharge: 2013-12-02 | Disposition: A | Discharge status: Home / Self Care | Payer: Medicare Other | Source: Ambulatory Visit | Attending: Surgery | Admitting: Surgery

## 2013-12-02 ENCOUNTER — Encounter: Payer: Self-pay | Admitting: Surgery

## 2013-12-02 VITALS — BP 153/68 | HR 55 | Ht 72.0 in | Wt 258.6 lb

## 2013-12-02 DIAGNOSIS — N186 End stage renal disease: Secondary | ICD-10-CM

## 2013-12-02 DIAGNOSIS — Z4931 Encounter for adequacy testing for hemodialysis: Secondary | ICD-10-CM

## 2013-12-02 NOTE — Progress Notes (Signed)
The patient is status post left radiocephalic fistula on 11/01/2013.  He currently is getting dialysis via a right-sided catheter.  He comes in for ultrasound.  The ultrasound today showed that the vein diameters range from 0.57-0.65.  The vein is approximately 0.5 cm deep.  There is a Thrill within the fistula.  His incision has healed nicely.  There is no evidence of steal syndrome.  The patient is doing very well his fistula will be ready for use on 02/01/2014.

## 2014-07-28 IMAGING — CR DG CHEST 2V
2 series · 2 of 2 positions shown · non-contrast
Comparison: Plain film of the chest 09/11/2013. CT chest
09/13/2013.

CLINICAL DATA: Preoperative examination.

EXAM:
CHEST  2 VIEW

[w chest pa]
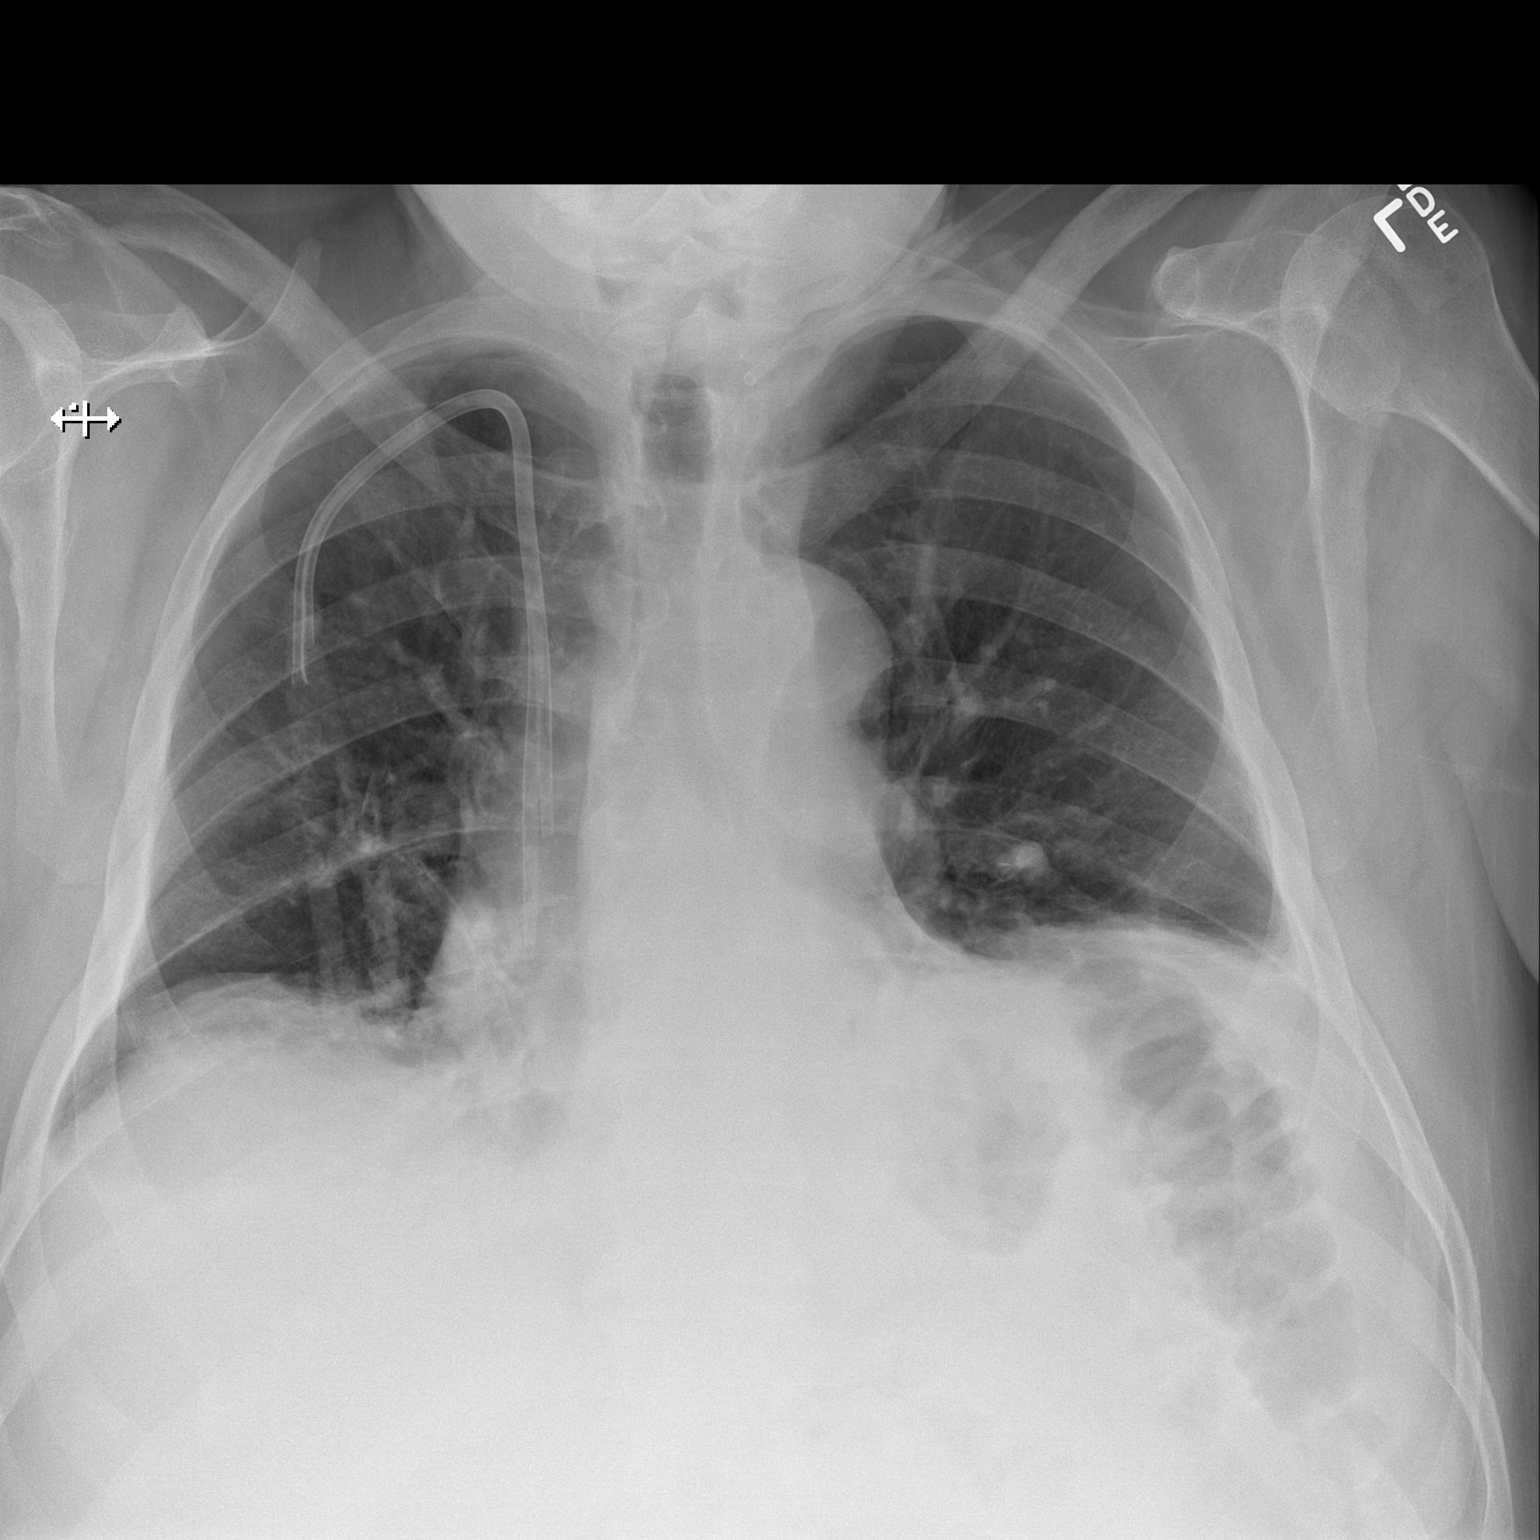

[w chest lat]
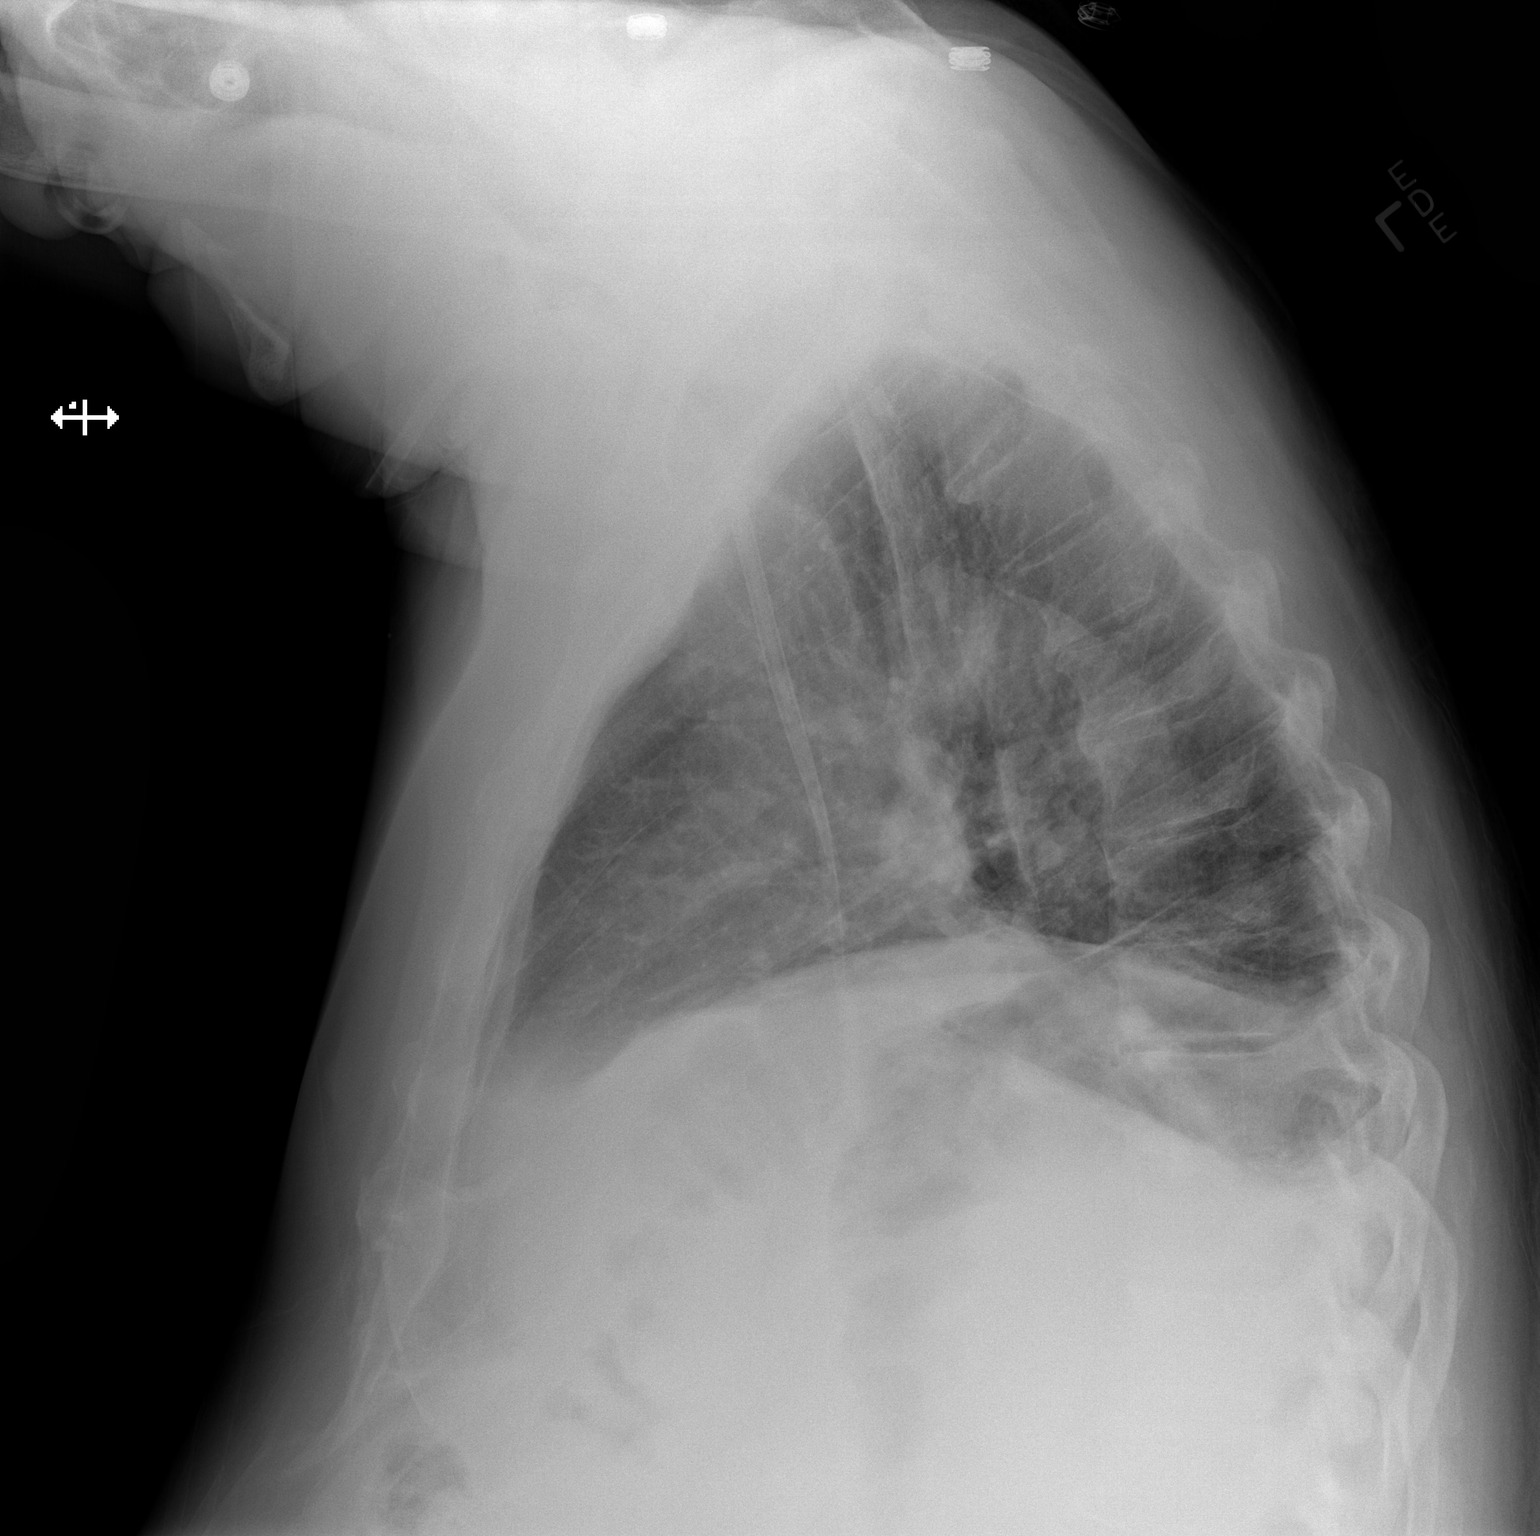

[2 of 2 positions shown; findings below may reference images not displayed]

FINDINGS: There are very small bilateral pleural effusions and mild basilar
atelectasis. The lungs are otherwise clear. Heart size is upper
normal. No pneumothorax. Dialysis catheter noted.
IMPRESSION: Small bilateral pleural effusions and mild basilar atelectasis.

## 2014-10-20 ENCOUNTER — Encounter: Payer: Self-pay | Admitting: Internal Medicine

## 2015-07-24 ENCOUNTER — Telehealth: Payer: Self-pay

## 2015-07-24 DIAGNOSIS — Z01810 Encounter for preprocedural cardiovascular examination: Secondary | ICD-10-CM

## 2015-07-24 DIAGNOSIS — T82510D Breakdown (mechanical) of surgically created arteriovenous fistula, subsequent encounter: Secondary | ICD-10-CM

## 2015-07-24 DIAGNOSIS — N186 End stage renal disease: Secondary | ICD-10-CM

## 2015-07-24 NOTE — Telephone Encounter (Signed)
Phone call from pts. Wife. Reported the [facility] is having difficulty cannulating and are pulling blood clots form pt's left arm AVF.  Reported he was sent [facility] recently to evaluate the fistula. Reported that they were told that a balloon procedure was attempted, but the AVF/ vessel was too narrow to balloon.  Was advised to contact vascular surgeon.  Discussed with Dr. Edilia Boickson.  Advised to schedule pt. for vein mapping of left upper arm, and duplex of dialysis access, and office appt.  Will contact pt. with appts.

## 2015-07-28 NOTE — Telephone Encounter (Signed)
Patient declined appointment stating that he thought he had his ultrasound already. His wife will obtain records and then call to r/s these appointments, dpm

## 2015-07-28 NOTE — Telephone Encounter (Signed)
[facility] have [facility] tomorrow and Friday. dpm

## 2015-07-29 ENCOUNTER — Encounter (HOSPITAL_COMMUNITY): Payer: Medicare Other

## 2015-07-30 NOTE — Telephone Encounter (Signed)
Patients wife had report of fistulagram sent from [facility]- per Okey Regalarol, in this case, we do not need to do ultrasounds. Spoke with wife to schedule appt, pt requests to see [facility], even with his first appt being 08/21/15

## 2015-07-31 ENCOUNTER — Ambulatory Visit: Payer: Medicare Other | Admitting: Vascular Surgery

## 2015-07-31 ENCOUNTER — Encounter: Payer: Self-pay | Admitting: Vascular Surgery

## 2015-07-31 ENCOUNTER — Ambulatory Visit (INDEPENDENT_AMBULATORY_CARE_PROVIDER_SITE_OTHER): Payer: Medicare Other | Admitting: Vascular Surgery

## 2015-07-31 VITALS — BP 122/62 | HR 77 | Temp 97.0°F | Resp 14 | Ht 72.0 in | Wt 261.0 lb

## 2015-07-31 DIAGNOSIS — N186 End stage renal disease: Secondary | ICD-10-CM | POA: Diagnosis not present

## 2015-07-31 NOTE — Progress Notes (Signed)
Established Dialysis Access  History of Present Illness  Chad Carter is a 81 y.o. (16-Jan-1934) male who presents for re-evaluation of L RC AVF.  This L RC AVF was placed by Dr. Myra Gianotti on 11/01/13.  Reportedly recently was having problems with cannulation.  He underwent a fistulogram on 07/21/15 which demonstrated mild narrowing of perianastomotic segment of the fistula and widely patent fistula otherwise.  He has successful dialyzed since that procedure.  The patient's PMH, PSH, SH, and FamHx are unchanged from 11/01/13.  Current Outpatient Prescriptions  Medication Sig Dispense Refill  . calcitRIOL (ROCALTROL) 0.25 MCG capsule Take 0.25 mcg by mouth daily.     . diphenoxylate-atropine (LOMOTIL) 2.5-0.025 MG per tablet Take 1 tablet by mouth 2 (two) times daily as needed for diarrhea or loose stools. Take 1 tab 3 times daily as needed.    . doxazosin (CARDURA) 2 MG tablet Take 2 mg by mouth daily.     . furosemide (LASIX) 40 MG tablet Take 40 mg by mouth 2 (two) times daily.     Marland Kitchen gabapentin (NEURONTIN) 100 MG capsule Take 100 mg by mouth at bedtime.     Marland Kitchen glipiZIDE (GLUCOTROL) 5 MG tablet Take 5 mg by mouth 2 (two) times daily before a meal.     . insulin glargine (LANTUS OPTICLIK) 100 UNIT/ML injection Inject 40 Units into the skin at bedtime. 10 mL   . Insulin Syringe-Needle U-100 (RELION INSULIN SYR 1CC/30G) 30G X 5/16" 1 ML MISC by Does not apply route.    Marland Kitchen levothyroxine (SYNTHROID, LEVOTHROID) 75 MCG tablet Take 75 mcg by mouth daily.    . metoprolol tartrate (LOPRESSOR) 25 MG tablet Take 12.5 mg by mouth 2 (two) times daily. TAKE 1 1/2 TABLET    . Multiple Vitamin (MULTIVITAMIN) tablet Take 1 tablet by mouth daily.    . pravastatin (PRAVACHOL) 40 MG tablet Take 40 mg by mouth daily.     . ranitidine (ZANTAC) 150 MG capsule Take 2 capsules by mouth daily.    Marland Kitchen amLODipine (NORVASC) 10 MG tablet Take 10 mg by mouth daily. Reported on 07/31/2015    . midodrine (PROAMATINE) 5 MG  tablet     . nitroGLYCERIN (NITROSTAT) 0.4 MG SL tablet     . oxyCODONE-acetaminophen (PERCOCET/ROXICET) 5-325 MG per tablet Take 1 tablet by mouth every 6 (six) hours as needed. (Patient not taking: Reported on 07/31/2015) 30 tablet 0  . sertraline (ZOLOFT) 50 MG tablet     . Vitamin D, Ergocalciferol, (DRISDOL) 50000 UNITS CAPS capsule Take 50,000 Units by mouth every 7 (seven) days. Wednesdays     No current facility-administered medications for this visit.    Allergies  Allergen Reactions  . Avandia [Rosiglitazone]     Made skin red  . Niacin And Related Rash    On ROS today: no steal sx, no further flow rates issue reportedly   Physical Examination  Filed Vitals:   07/31/15 1547  BP: 122/62  Pulse: 77  Temp: 97 F (36.1 C)  Resp: 14  Height: 6' (1.829 m)  Weight: 261 lb (118.389 kg)  SpO2: 100%   Body mass index is 35.39 kg/(m^2).  General: A&O x 3, WD, WN  Pulmonary: Sym exp, good air movt, CTAB, no rales, rhonchi, & wheezing  Cardiac: RRR, Nl S1, S2, no Murmurs, rubs or gallops  Vascular: Vessel Right Left  Radial Palpable Palpable  Ulnar Not Palpable Not Palpable  Brachial Palpable Palpable   Gastrointestinal: soft, NTND,  no G/R, bo HSM, no masses, no CVAT B  Musculoskeletal: M/S 5/5 throughout , Extremities without ischemic changes, palpable thrill in access in L forearm, bruit in access, two cannulation bandage in place in L forearm  Neurologic: Pain and light touch intact in extremities , Motor exam as listed above   Medical Decision Making  Chad Carter is a 81 y.o. male who presents with ESRD requiring hemodialysis, likely perianastomotic stenosis   No imaging available.  Fistulogram report reviewed.  Patient is successfully completing HD, so no immediate intervention needed.  Follow up as needed.   Chad SakeBrian Emmajo Bennette, MD [facility] [place] Office: [phone] Pager: [phone]  07/31/2015, 4:45 PM

## 2015-08-05 ENCOUNTER — Telehealth: Payer: Self-pay

## 2015-08-05 ENCOUNTER — Encounter: Payer: Self-pay | Admitting: Interventional Radiology

## 2015-08-05 NOTE — Telephone Encounter (Signed)
Rec'd phone call from pt's. Wife.  Reported the pt. Continues to have problems at dialysis with difficult cannulation by the nurses and techs.  Requested to reschedule appt. with Dr. Myra GianottiBrabham for further evaluation.  Stated he [facility], and was advised that there was a "blockage."  Phone call to the [facility]; spoke with nurse, Eileen StanfordJenna.  Per Eileen StanfordJenna, she is the only nurse that has ability to successfully cannulate the AVF, and the other staff haven't been successful.  Stated that the pt's. arterial pressures were high with recent treatment.  Also, stated that the pt. is concerned about how hard it is to cannulate the fistula, due to future plan to transfer to another HD center.  Stated the pt. would like to reschedule appt. With Dr. Myra GianottiBrabham, because he put the fistula in.  Advised will schedule appt. With Dr. Myra GianottiBrabham, and will request pt. To obtain the fistul[facility] film [facility]  to bring to appt.

## 2015-08-05 NOTE — Telephone Encounter (Signed)
Spoke with pts wife to r.s/ the fistulogram is here already in the brown folder at medical records, dpm

## 2015-08-13 ENCOUNTER — Encounter: Payer: Self-pay | Admitting: Vascular Surgery

## 2015-08-20 ENCOUNTER — Encounter: Payer: Self-pay | Admitting: Surgery

## 2015-08-21 ENCOUNTER — Ambulatory Visit: Payer: Medicare Other | Admitting: Surgery

## 2015-08-24 ENCOUNTER — Ambulatory Visit (INDEPENDENT_AMBULATORY_CARE_PROVIDER_SITE_OTHER): Payer: Medicare Other | Admitting: Surgery

## 2015-08-24 ENCOUNTER — Encounter: Payer: Self-pay | Admitting: Surgery

## 2015-08-24 VITALS — BP 133/65 | HR 80 | Ht 72.0 in | Wt 260.1 lb

## 2015-08-24 DIAGNOSIS — N186 End stage renal disease: Secondary | ICD-10-CM

## 2015-08-24 NOTE — Progress Notes (Signed)
Patient name: Chad BarthelKenneth A Carter MRN: [id number] DOB: 07/03/1934 Sex: male     Chief Complaint  Patient presents with  . Re-evaluation    difficulty cannulation of L arm AVF     HISTORY OF PRESENT ILLNESS:  the patient is status post left radiocephalic fistula creation on 11/01/2013. He has been using his fistula without significant difficulties.  However he does state that occasionally he will have clots pulled. In addition, he has had episodes where it was difficult to access.  He had a fist[facility] which reportedly shows narrowing at the anastomosis. He is here today for further discussions  Past Medical History  Diagnosis Date  . Diabetes mellitus type 2, insulin dependent (HCC)   . Hypertension   . High cholesterol   . High triglycerides   . Obesity   . Duodenal ulcer with hemorrhage 10/2009, 01/2012  . Irritable bowel syndrome (IBS)     diarrhea predominent  . Anemia due to blood loss 10/2009, 01/2012    2 PRBCs 01/2012.   . Vitamin B 12 deficiency   . Hypothyroidism   . Hx of adenomatous colonic polyps 10/2009    [facility]oscopy [facility]  . Diverticulosis 10/2009  . Internal hemorrhoids 10/2009  . Atrial fibrillation (HCC)   . Sleep apnea, obstructive     never fitted for CPAP, on Oxygen 1 liter  . CHF (congestive heart failure) (HCC)   . Shortness of breath   . Pneumonia   . Chronic kidney disease     Dialysis T/Th/Sat  . GERD (gastroesophageal reflux disease)   . Arthritis   . Complication of anesthesia     had trouble waking up with last surgery. Having sob and can't lay flat.    Past Surgical History  Procedure Laterality Date  . Cholecystectomy    . Arm surg    . Total knee arthroplasty      bilateral  . Ulcer surg    . Esophagogastroduodenoscopy  01/25/2012    01/25/2012 EGD : Rachael Feeaniel P Jacobs, placed 3 endoclips to oozing DU. of bulb.   . Colonoscopy    . Esophagogastroduodenoscopy  02/02/2012    Procedure:  ESOPHAGOGASTRODUODENOSCOPY (EGD);  Surgeon: Iva Booparl E Gessner, MD;  Location: [facility];  Service: Endoscopy;  Laterality: N/A;  . Port a cath revision      put in 08/2013  . Eye surgery Bilateral     cataract surgery  . Back surgery      neck and back surgery  . Av fistula placement Left 11/01/2013    Procedure: ARTERIOVENOUS (AV) FISTULA CREATION- LEFT;  Surgeon: Nada LibmanVance W Davaughn Hillyard, MD;  Location: [facility] OR;  Service: Vascular;  Laterality: Left;    Social History   Social History  . Marital Status: Married    Spouse Name: Sallye OberLouise  . Number of Children: 3  . Years of Education: N/A   Occupational History  . Retired    Social History Main Topics  . Smoking status: Never Smoker   . Smokeless tobacco: Never Used  . Alcohol Use: No  . Drug Use: No  . Sexual Activity: Not on file   Other Topics Concern  . Not on file   Social History Narrative    Family History  Problem Relation Age of Onset  . Uterine cancer Mother   . Diabetes Mother   . Cancer Mother   . Heart disease Mother   . Hyperlipidemia Mother   . Hypertension Mother   .  Heart disease Father   . Varicose Veins Father   . Heart attack Father   . Diabetes Maternal Grandmother   . Diabetes Maternal Grandfather   . Diabetes Paternal Grandmother   . Diabetes Paternal Grandfather   . Colon cancer      unknown if in family  . Hypertension Sister     Allergies as of 08/24/2015 - Review Complete 08/24/2015  Allergen Reaction Noted  . Miconazole Other (See Comments) 08/24/2015  . Tape Other (See Comments) 08/24/2015  . Avandia [rosiglitazone] Rash 07/31/2012  . Niacin and related Rash 01/24/2012    Current Outpatient Prescriptions on File Prior to Visit  Medication Sig Dispense Refill  . calcitRIOL (ROCALTROL) 0.25 MCG capsule Take 0.25 mcg by mouth daily.     Marland Kitchen doxazosin (CARDURA) 2 MG tablet Take 2 mg by mouth daily.     . furosemide (LASIX) 40 MG tablet Take 40 mg by mouth 2 (two) times daily.     Marland Kitchen  gabapentin (NEURONTIN) 100 MG capsule Take 100 mg by mouth at bedtime.     Marland Kitchen glipiZIDE (GLUCOTROL) 5 MG tablet Take 5 mg by mouth 2 (two) times daily before a meal.     . insulin glargine (LANTUS OPTICLIK) 100 UNIT/ML injection Inject 40 Units into the skin at bedtime. 10 mL   . Insulin Syringe-Needle U-100 (RELION INSULIN SYR 1CC/30G) 30G X 5/16" 1 ML MISC by Does not apply route.    Marland Kitchen levothyroxine (SYNTHROID, LEVOTHROID) 75 MCG tablet Take 75 mcg by mouth daily.    . metoprolol tartrate (LOPRESSOR) 25 MG tablet Take 12.5 mg by mouth 2 (two) times daily. TAKE 1 1/2 TABLET    . midodrine (PROAMATINE) 5 MG tablet     . Multiple Vitamin (MULTIVITAMIN) tablet Take 1 tablet by mouth daily.    . nitroGLYCERIN (NITROSTAT) 0.4 MG SL tablet     . ranitidine (ZANTAC) 150 MG capsule Take 2 capsules by mouth daily.    . sertraline (ZOLOFT) 50 MG tablet     . Vitamin D, Ergocalciferol, (DRISDOL) 50000 UNITS CAPS capsule Take 50,000 Units by mouth every 7 (seven) days. Wednesdays    . amLODipine (NORVASC) 10 MG tablet Take 10 mg by mouth daily. Reported on 08/24/2015    . diphenoxylate-atropine (LOMOTIL) 2.5-0.025 MG per tablet Take 1 tablet by mouth 2 (two) times daily as needed for diarrhea or loose stools. Reported on 08/24/2015    . oxyCODONE-acetaminophen (PERCOCET/ROXICET) 5-325 MG per tablet Take 1 tablet by mouth every 6 (six) hours as needed. (Patient not taking: Reported on 07/31/2015) 30 tablet 0  . pravastatin (PRAVACHOL) 40 MG tablet Take 40 mg by mouth daily. Reported on 08/24/2015     No current facility-administered medications on file prior to visit.     REVIEW OF SYSTEMS: Cardiovascular: No chest pain Pulmonary: home O2. Neurologic: No weakness, paresthesias, aphasia, or amaurosis. No dizziness. Musculoskeletal:  Uses walker. Gastrointestinal: No blood in stool or hematemesis Genitourinary:  On dialysis. Constitutional: No fever or chills.  PHYSICAL EXAMINATION:   Vital signs are    Filed Vitals:   08/24/15 1321  BP: 133/65  Pulse: 80  Height: 6' (1.829 m)  Weight: 260 lb 1.6 oz (117.981 kg)  SpO2: 99%   Body mass index is 35.27 kg/(m^2). General: The patient appears their stated age. HEENT:  No gross abnormalities Pulmonary:  Non labored breathing Skin: There are no ulcer or rashes noted. Psychiatric: The patient has normal affect. Cardiovascular:  Palpable thrill within  left radiocephalic fistula.  No edema   Diagnostic Studies  none  Assessment:  status post left radiocephalic fistula creation with access difficulty Plan:  the patient [place].  I do not have these images available, nor do I have a report.  I'm uncomfortable make any recommendations until I view the images.  I do not want to repeat the fistulogram which was just done several weeks ago.  I have requested that the family get a copy of the CD and have mailed to my office and then I will contact them with the next step and trying to resolve his issues.  Jorge Ny, M.D. [facility] Office: [phone] Pager:  [phone]

## 2015-08-26 NOTE — Progress Notes (Addendum)
Family brought in a CD from [facility] of a fistulogram performed on 07-21-2015.  I will place it on Dr. Estanislado SpireBrabham's desk for review. Patient's family is aware that it may be the 1st of next week before we get back to them because of Dr. Estanislado SpireBrabham's office schedule.

## 2016-09-15 DEATH — deceased
